# Patient Record
Sex: Female | Born: 1970
Health system: Southern US, Community
[De-identification: ages and names within clinical notes are randomized; demographics above are authoritative.]

## PROBLEM LIST (undated history)

## (undated) DIAGNOSIS — I10 Essential (primary) hypertension: Secondary | ICD-10-CM

## (undated) DIAGNOSIS — E7800 Pure hypercholesterolemia, unspecified: Secondary | ICD-10-CM

## (undated) DIAGNOSIS — E119 Type 2 diabetes mellitus without complications: Secondary | ICD-10-CM

## (undated) DIAGNOSIS — K219 Gastro-esophageal reflux disease without esophagitis: Secondary | ICD-10-CM

## (undated) HISTORY — DX: Type 2 diabetes mellitus without complications: E11.9

## (undated) HISTORY — PX: BREAST REDUCTION SURGERY: SHX8

## (undated) HISTORY — DX: Essential (primary) hypertension: I10

## (undated) HISTORY — PX: ABDOMINAL HYSTERECTOMY: SHX81

## (undated) HISTORY — DX: Pure hypercholesterolemia, unspecified: E78.00

## (undated) HISTORY — PX: REDUCTION MAMMAPLASTY: SUR839

---

## 2005-03-18 ENCOUNTER — Ambulatory Visit: Payer: Self-pay

## 2005-04-25 ENCOUNTER — Ambulatory Visit: Payer: Self-pay

## 2007-08-27 HISTORY — PX: CHOLECYSTECTOMY: SHX55

## 2008-02-04 ENCOUNTER — Ambulatory Visit: Payer: Self-pay | Admitting: Internal Medicine

## 2010-07-12 ENCOUNTER — Ambulatory Visit: Payer: Self-pay | Admitting: Internal Medicine

## 2011-07-25 ENCOUNTER — Ambulatory Visit: Payer: Self-pay | Admitting: Unknown Physician Specialty

## 2011-07-30 ENCOUNTER — Ambulatory Visit: Payer: Self-pay | Admitting: Internal Medicine

## 2013-08-05 ENCOUNTER — Ambulatory Visit: Payer: Self-pay | Admitting: Unknown Physician Specialty

## 2013-09-20 ENCOUNTER — Institutional Professional Consult (permissible substitution): Payer: Self-pay | Admitting: Pulmonary Disease

## 2013-09-24 ENCOUNTER — Encounter: Payer: Self-pay | Admitting: Pulmonary Disease

## 2013-09-24 ENCOUNTER — Ambulatory Visit (INDEPENDENT_AMBULATORY_CARE_PROVIDER_SITE_OTHER): Payer: 59 | Admitting: Pulmonary Disease

## 2013-09-24 VITALS — BP 136/84 | HR 77 | Temp 98.6°F | Ht 67.75 in | Wt 270.0 lb

## 2013-09-24 DIAGNOSIS — R059 Cough, unspecified: Secondary | ICD-10-CM

## 2013-09-24 DIAGNOSIS — R05 Cough: Secondary | ICD-10-CM

## 2013-09-24 MED ORDER — IPRATROPIUM BROMIDE 0.03 % NA SOLN: 2.0000 | Freq: Four times a day (QID) | NASAL | Status: DC

## 2013-09-24 NOTE — Progress Notes (Signed)
Subjective:    Patient ID: Susan Kramer, female    DOB: [birth year], 43 y.o.   MRN: [id number]  HPI  Susan Kramer is referred to [place] for chronic cough.  She has seen Dr. Clovis Pu her PCP and Dr. Tami Ribas with [facility] regarding this.  She has had her BP medicines changed, and had a negative CT sinues and allergy testing.  Apparently the CT was normal and the allergy testing was normal too.  She has had the cough for a year not.  She feels that it has been getting worse. Some days she coughs all day until bedtime  She does not waking up coughing in the mddle of the night.  She coughs a lot in the mornig s and when she is eating.  She will someties having coughing spells and will throw up afterwards.  Sometimes she produces clear to yellow sputum productions.  She is not having vomiting, but she produces sptums.  Th esputum production has worsened in the last year.  Childhood was normal no respiratory illnesses.  She did have a bad spell of bronchitis once two years ago and she was given an inhaler for wheezing.  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    When she gets upset about something her cough gets worse. Perfumes and cough will make the cough worse.  Sometimes it feels like food idsn't digesting and that's why she coughs.  Drinking coffee makes her cough more. She has dyspnea only with bad coughing.    She does have a lot of sinus congestion and feels like she has a cold all the time. She feels a lot of stuffy nose.  She blows her nose a lot.   She has been taking alka-seltzer over the ocunter and other cough remedies.   She has had problems with cough, mucus production, with pollen  Never smoker  She still has indigestion despite pantoprazole twice a day.  She doesn't have heart burn, but she feels food come up after eating.  Food doen's get stuck.   Past Medical History  Diagnosis Date  . High blood pressure   . High cholesterol   . Diabetes      Family  History  Problem Relation Age of Onset  . Diabetes Mother      History   Social History  . Marital Status: Married    Spouse Name: N/A    Number of Children: N/A  . Years of Education: N/A   Occupational History  . Not on file.   Social History Main Topics  . Smoking status: Never Smoker   . Smokeless tobacco: Never Used  . Alcohol Use: Yes     Comment: occasional wine  . Drug Use: No  . Sexual Activity: Not on file   Other Topics Concern  . Not on file   Social History Narrative  . No narrative on file     Allergies no known allergies   No outpatient prescriptions prior to visit.   No facility-administered medications prior to visit.      Review of Systems  Constitutional: Negative for fever and unexpected weight change.  HENT: Positive for congestion. Negative for dental problem, ear pain, nosebleeds, postnasal drip, rhinorrhea, sinus pressure, sneezing, sore throat and trouble swallowing.   Eyes: Negative for redness and itching.  Respiratory: Positive for cough. Negative for chest tightness, shortness of breath and wheezing.   Cardiovascular: Negative for palpitations and leg  swelling.  Gastrointestinal: Negative for nausea and vomiting.  Genitourinary: Negative for dysuria.  Musculoskeletal: Negative for joint swelling.  Skin: Negative for rash.  Neurological: Negative for headaches.  Hematological: Does not bruise/bleed easily.  Psychiatric/Behavioral: Negative for dysphoric mood. The patient is not nervous/anxious.        Objective:   Physical Exam Filed Vitals:   09/24/13 0909  BP: 136/84  Pulse: 77  Temp: 98.6 F (37 C)  TempSrc: Oral  Height: 5' 7.75" (1.721 m)  Weight: 270 lb (122.471 kg)  SpO2: 99%   RA  Gen: well appearing, no acute distress HEENT: NCAT, PERRL, EOMi, OP clear, neck supple without masses PULM: CTA B CV: RRR, no mgr, no JVD AB: BS+, soft, nontender, no hsm Ext: warm, no edema, no clubbing, no cyanosis Derm: no  rash or skin breakdown Neuro: A&Ox4, CN II-XII intact, strength 5/5 in all 4 extremities  08/2013 CXR reviewed> normal     Assessment & Plan:   Cough Based on her normal exam, oximetry, and Chest X-ray I don't think we are dealing with lung pathology here.  However we will get PFTs for completeness sake.  I really think that her cough is due to ongoing acid reflux despite the BID ppi.  She has had a very thorough work up to far and the approach taken by Drs. Caryl Comes and Tami Ribas is exactly what I would have done up until this point. However, she continues to have symptoms of indigestion and cough worse after eating.  Post nasal drip appears to contribute somewhat to her symptoms, probably from some degree of post nasal drip.  There may also be some degree of cyclical cough (cough due to laryngeal irritation caused by cough).    Plan: -voice rest, delsym, throat soothing candies/warm beverages recommended -nasonex bid (instructed on proper use) -chlorpheniramine-phenylephrine tabs as needed  -GERD lifestyle modification reviewed at length and literature provided -if no improvement with these measures, I think she needs a 24 hours esophageal impedence/pH vs manometry to evaluate her esophagus   Updated Medication List Outpatient Encounter Prescriptions as of 09/24/2013  Medication Sig  . atorvastatin (LIPITOR) 80 MG tablet Take 80 mg by mouth at bedtime.  Marland Kitchen glimepiride (AMARYL) 2 MG tablet Take 2 mg by mouth 2 (two) times daily.  . metFORMIN (GLUCOPHAGE) 1000 MG tablet Take 1,000 mg by mouth 2 (two) times daily with a meal.  . pantoprazole (PROTONIX) 40 MG tablet Take 40 mg by mouth 2 (two) times daily.  Marland Kitchen telmisartan (MICARDIS) 40 MG tablet Take 40 mg by mouth daily. 2 po daily  . ipratropium (ATROVENT) 0.03 % nasal spray Place 2 sprays into the nose 4 (four) times daily.

## 2013-09-24 NOTE — Patient Instructions (Addendum)
Use ipratropium nasal spray 2 sprays each nostril three to four times a day for sinus congestion Use chlorpheniramine-phenylephrine combination tablets as needed for sinus congestion   Follow the GERD lifestyle sheet we provided you in the office today. Continue taking the pantoprazole twice a day as prescribed by Dr. Caryl Comes  We will see you back in 3-4 weeks or sooner if needed

## 2013-09-27 DIAGNOSIS — R059 Cough, unspecified: Secondary | ICD-10-CM | POA: Insufficient documentation

## 2013-09-27 DIAGNOSIS — R05 Cough: Secondary | ICD-10-CM | POA: Insufficient documentation

## 2013-09-27 NOTE — Assessment & Plan Note (Addendum)
Based on her normal exam, oximetry, and Chest X-ray I don't think we are dealing with lung pathology here.  However we will get PFTs for completeness sake.  I really think that her cough is due to ongoing acid reflux despite the BID ppi.  She has had a very thorough work up to far and the approach taken by Drs. Caryl Comes and Tami Ribas is exactly what I would have done up until this point. However, she continues to have symptoms of indigestion and cough worse after eating.  Post nasal drip appears to contribute somewhat to her symptoms, probably from some degree of post nasal drip.  There may also be some degree of cyclical cough (cough due to laryngeal irritation caused by cough).    Plan: -voice rest, delsym, throat soothing candies/warm beverages recommended -nasonex bid (instructed on proper use) -chlorpheniramine-phenylephrine tabs as needed  -GERD lifestyle modification reviewed at length and literature provided -if no improvement with these measures, I think she needs a 24 hours esophageal impedence/pH vs manometry to evaluate her esophagus

## 2013-10-05 ENCOUNTER — Telehealth: Payer: Self-pay | Admitting: Pulmonary Disease

## 2013-10-05 ENCOUNTER — Ambulatory Visit: Payer: Self-pay | Admitting: Pulmonary Disease

## 2013-10-05 LAB — PULMONARY FUNCTION TEST

## 2013-10-06 NOTE — Telephone Encounter (Signed)
error 

## 2013-10-13 ENCOUNTER — Encounter: Payer: Self-pay | Admitting: Pulmonary Disease

## 2013-10-14 ENCOUNTER — Telehealth: Payer: Self-pay

## 2013-10-14 NOTE — Telephone Encounter (Signed)
lmtcb X1 

## 2013-10-14 NOTE — Telephone Encounter (Signed)
Message copied by Len Blalock on Thu Oct 14, 2013  4:38 PM ------      Message from: Simonne Maffucci B      Created: Wed Oct 13, 2013 10:59 PM       A,            Her pfts were normal with the exception of a finding that showed her ability to take up oxygen from the air into her blood was a little limited.  This can be finicky test and mean nothing sometimes, but we should follow it up none the less. Please let her know that I want to see the results of her most recent CBC, and if there hasn't been one in a few months then we need to order a new one.            Thanks,      B ------

## 2013-10-15 NOTE — Telephone Encounter (Signed)
Spoke with pt, she is aware of PFT results.  She has not had labs drawn recently.  States that her cough is still persistent, was wanting to come back in and see BQ.  She states that she would prefer to wait and have her labs drawn at her next visit.  I have scheduled her for next Friday, the 27th, at 2:15 in [place].  Nothing further needed at this time.

## 2013-10-22 ENCOUNTER — Ambulatory Visit (INDEPENDENT_AMBULATORY_CARE_PROVIDER_SITE_OTHER): Payer: 59 | Admitting: Pulmonary Disease

## 2013-10-22 ENCOUNTER — Encounter: Payer: Self-pay | Admitting: Internal Medicine

## 2013-10-22 ENCOUNTER — Encounter: Payer: Self-pay | Admitting: Pulmonary Disease

## 2013-10-22 VITALS — BP 136/84 | HR 88 | Ht 67.75 in | Wt 270.0 lb

## 2013-10-22 DIAGNOSIS — R942 Abnormal results of pulmonary function studies: Secondary | ICD-10-CM

## 2013-10-22 DIAGNOSIS — R05 Cough: Secondary | ICD-10-CM

## 2013-10-22 DIAGNOSIS — R059 Cough, unspecified: Secondary | ICD-10-CM

## 2013-10-22 LAB — CBC
HCT: 41 % (ref 36.0–46.0)
Hemoglobin: 13.1 g/dL (ref 12.0–15.0)
MCHC: 31.8 g/dL (ref 30.0–36.0)
MCV: 82.3 fl (ref 78.0–100.0)
Platelets: 362 10*3/uL (ref 150.0–400.0)
RBC: 4.98 Mil/uL (ref 3.87–5.11)
RDW: 13.3 % (ref 11.5–14.6)
WBC: 9.7 10*3/uL (ref 4.5–10.5)

## 2013-10-22 MED ORDER — IPRATROPIUM BROMIDE 0.03 % NA SOLN: 2.0000 | Freq: Four times a day (QID) | NASAL | Status: DC

## 2013-10-22 MED ORDER — BENZONATATE 100 MG PO CAPS: 100.0000 mg | ORAL_CAPSULE | Freq: Three times a day (TID) | ORAL | Status: DC | PRN

## 2013-10-22 NOTE — Progress Notes (Signed)
Subjective:    Patient ID: Susan Kramer, female    DOB: 01-Dec-1970, 43 y.o.   MRN: [id number]  Synopsis: Ms. Howden first saw the [facility] in early 2015 for cough.  She has had an extensive work up by her PCP and ENT physicians prior to seeing [place] which included a negative CT sinuses and allergy testing.  The cough was noted to be increased after eating and she noted having the sensation of a stuffy nose a lot. Despite taking BID PPI she noted the sensation of regurgitation frequently.  She had not seen a gastroenterologist.  HPI  10/22/2013 ROV > [place] thinks that the nasal spray helps her cough some.  Some days are worse than others. When she has a cough it makes the cough worse some. Yesterday for example she coughed up a lot of clear mucus.  No shortness of breath.  She noticed that her breathing was better after taking the albuterol treatment for the PFT test.    Past Medical History  Diagnosis Date  . High blood pressure   . High cholesterol   . Diabetes      Review of Systems  Constitutional: Negative for fever, chills and fatigue.  HENT: Positive for postnasal drip, rhinorrhea and sinus pressure.   Respiratory: Positive for cough and shortness of breath. Negative for wheezing.   Cardiovascular: Negative for chest pain, palpitations and leg swelling.       Objective:   Physical Exam Filed Vitals:   10/22/13 1434  BP: 136/84  Pulse: 88  Height: 5' 7.75" (1.721 m)  Weight: 270 lb (122.471 kg)  SpO2: 97%   RA  Ambulated 500 feet on RA and did not desaturate  Gen: obese, well appearing, no acute distress HEENT: NCAT, EOMi, OP clear, PULM: CTA B CV: RRR, no mgr, no JVD AB: BS+, soft, nontender, no hsm Ext: warm, trace edema, no clubbing, no cyanosis Derm: no rash or skin breakdown  09/2013 PFT> normal ratio, FEV1 2.34 L (80%), no change with BD, TLC 4.56L (81% pred), DLCO 19.6 (66% pred)     Assessment & Plan:   Cough I believe strongly that her  cough is do to acid reflux as her diet affects the cough significantly. She does not have much evidence of ongoing postnasal drip and there is no clear evidence of an underlying lung disease save the minor abnormality seen on the pulmonary function tests, see below.  Plan: -Continue acid reflux lifestyle modification -Continue twice a day PPI -Refer to GI medicine for consideration of 24-hour impedence plus pH probe or further evaluation as directed by Dr. Zenovia Jarred   Abnormal PFT Her recent pulmonary function testing were completely normal with the exception of an isolated low DLCO. This can be due to anemia, disorders of ulnar parenchymal interstitium, or pulmonary vascular disease. We did not have a recent hemoglobin to compare to so we will check that today. If her hemoglobin is negative then she needs to undergo an echocardiogram and a CT scan of her chest to look for further evidence of lung disease. However, I do not expect that there'll be any evidence of lung disease given her normal exam and oximetry. Today in the office we had her ambulate 500 feet on room air and her oxygen level dropped no lower than 96%.  Plan: -Check CBC for hemoglobin level -If hemoglobin normal then order echocardiogram and CT chest    Updated Medication List Outpatient Encounter Prescriptions as of 10/22/2013  Medication Sig  . atorvastatin (LIPITOR) 80 MG tablet Take 80 mg by mouth at bedtime.  Marland Kitchen glimepiride (AMARYL) 2 MG tablet Take 2 mg by mouth 2 (two) times daily.  Marland Kitchen ipratropium (ATROVENT) 0.03 % nasal spray Place 2 sprays into the nose 4 (four) times daily.  . metFORMIN (GLUCOPHAGE) 1000 MG tablet Take 1,000 mg by mouth 2 (two) times daily with a meal.  . pantoprazole (PROTONIX) 40 MG tablet Take 40 mg by mouth 2 (two) times daily.  Marland Kitchen telmisartan (MICARDIS) 40 MG tablet Take 40 mg by mouth daily. 2 po daily

## 2013-10-22 NOTE — Patient Instructions (Signed)
We will refer you to [facility] ([place]) for evaluation of ongoing acid reflux causing your cough Continue taking the ipratropium nasal spray as needed for sinus congestion We will call you with the results of your blood work Use the Tessalon perles as needed for cough We will see you back in 6-8 weeks or sooner if needed

## 2013-10-22 NOTE — Assessment & Plan Note (Signed)
Her recent pulmonary function testing were completely normal with the exception of an isolated low DLCO. This can be due to anemia, disorders of ulnar parenchymal interstitium, or pulmonary vascular disease. We did not have a recent hemoglobin to compare to so we will check that today. If her hemoglobin is negative then she needs to undergo an echocardiogram and a CT scan of her chest to look for further evidence of lung disease. However, I do not expect that there'll be any evidence of lung disease given her normal exam and oximetry. Today in the office we had her ambulate 500 feet on room air and her oxygen level dropped no lower than 96%.  Plan: -Check CBC for hemoglobin level -If hemoglobin normal then order echocardiogram and CT chest

## 2013-10-22 NOTE — Assessment & Plan Note (Signed)
I believe strongly that her cough is do to acid reflux as her diet affects the cough significantly. She does not have much evidence of ongoing postnasal drip and there is no clear evidence of an underlying lung disease save the minor abnormality seen on the pulmonary function tests, see below.  Plan: -Continue acid reflux lifestyle modification -Continue twice a day PPI -Refer to GI medicine for consideration of 24-hour impedence plus pH probe or further evaluation as directed by Dr. Zenovia Jarred

## 2013-10-23 MED ORDER — ALBUTEROL SULFATE HFA 108 (90 BASE) MCG/ACT IN AERS: 2.0000 | INHALATION_SPRAY | Freq: Four times a day (QID) | RESPIRATORY_TRACT | Status: DC | PRN

## 2013-10-25 ENCOUNTER — Telehealth: Payer: Self-pay

## 2013-10-25 NOTE — Telephone Encounter (Signed)
Message copied by Len Blalock on Mon Oct 25, 2013  8:48 AM ------      Message from: Juanito Doom      Created: Sat Oct 23, 2013  8:54 AM       A,            Can you call her and let her know I sent in an Rx for albuterol since she said that she felt better after using it during her PFT.            Thanks      B ------

## 2013-10-25 NOTE — Telephone Encounter (Signed)
Spoke with pt's spouse to have pt cb when she is available.

## 2013-10-26 ENCOUNTER — Telehealth: Payer: Self-pay

## 2013-10-26 NOTE — Telephone Encounter (Signed)
Pt aware of lab results and that the albuterol has been called in.  Nothing further needed.

## 2013-10-26 NOTE — Progress Notes (Signed)
lmtcb X1 

## 2013-10-26 NOTE — Telephone Encounter (Signed)
Message copied by Len Blalock on Tue Oct 26, 2013  9:38 AM ------      Message from: Juanito Doom      Created: Tue Oct 26, 2013  5:52 AM       A,      Please let her know her blood work was good      Thanks      B ------

## 2013-11-10 ENCOUNTER — Encounter: Payer: Self-pay | Admitting: Pulmonary Disease

## 2013-11-15 ENCOUNTER — Telehealth: Payer: Self-pay | Admitting: Pulmonary Disease

## 2013-11-15 NOTE — Telephone Encounter (Signed)
OK, but just let her know that those tests were ordered because the PFT suggested there was a problem with oxygen getting into her blood stream.  I think that it is unlikely she has a lung problem, but just make sure she understands that they were ordered for this reason and not the cough

## 2013-11-15 NOTE — Telephone Encounter (Signed)
Called and spoke with pt. She is scheduled to see Dr. Hilarie Fredrickson 11/27/13 for eval. She wants to wait and have the CT chest and u/s done until after she see's Dr Hilarie Fredrickson to make sure it is not GI related. She reports her insurance does not pay for any of these tests and they are expensive. If she finds it is not GI related then she will call us back to have these tests scheduled. FYI for Dr. Lake Bells

## 2013-11-16 NOTE — Telephone Encounter (Signed)
Called made pt aware. Nothing further needed 

## 2013-11-29 ENCOUNTER — Encounter: Payer: Self-pay | Admitting: Internal Medicine

## 2013-12-03 ENCOUNTER — Encounter: Payer: Self-pay | Admitting: Internal Medicine

## 2013-12-03 ENCOUNTER — Ambulatory Visit: Payer: 59 | Admitting: Internal Medicine

## 2013-12-03 ENCOUNTER — Ambulatory Visit (INDEPENDENT_AMBULATORY_CARE_PROVIDER_SITE_OTHER): Payer: 59 | Admitting: Internal Medicine

## 2013-12-03 VITALS — BP 140/88 | HR 88 | Ht 67.32 in | Wt 267.2 lb

## 2013-12-03 DIAGNOSIS — K219 Gastro-esophageal reflux disease without esophagitis: Secondary | ICD-10-CM

## 2013-12-03 DIAGNOSIS — R059 Cough, unspecified: Secondary | ICD-10-CM

## 2013-12-03 DIAGNOSIS — R053 Chronic cough: Secondary | ICD-10-CM

## 2013-12-03 DIAGNOSIS — R05 Cough: Secondary | ICD-10-CM

## 2013-12-03 NOTE — Patient Instructions (Signed)
Stay on pantoprazole 40 mg twice a day.  Call us back to schedule an endoscopy which is recommended by Dr. Hilarie Fredrickson   [phone]                                               We are excited to introduce MyChart, a new best-in-class service that provides you online access to important information in your electronic medical record. We want to make it easier for you to view your health information - all in one secure location - when and where you need it. We expect MyChart will enhance the quality of care and service we provide.  When you register for MyChart, you can:    View your test results.    Request appointments and receive appointment reminders via email.    Request medication renewals.    View your medical history, allergies, medications and immunizations.    Communicate with your physician's office through a password-protected site.    Conveniently print information such as your medication lists.  To find out if MyChart is right for you, please talk to a member of our clinical staff today. We will gladly answer your questions about this free health and wellness tool.  If you are age 45 or older and want a member of your family to have access to your record, you must provide written consent by completing a proxy form available at our office. Please speak to our clinical staff about guidelines regarding accounts for patients younger than age 1.  As you activate your MyChart account and need any technical assistance, please call the MyChart technical support line at [phone]) or email your question to [email or web address]. If you email your question(s), please include your name, a return phone number and the best time to reach you.  If you have non-urgent health-related questions, you can send a message to our office through [place] at [email or web address]. If you have a medical emergency, call 911.  Thank you for using MyChart as your new health and  wellness resource!   MyChart licensed from [employer],  1999-2010. Patents Pending.

## 2013-12-03 NOTE — Progress Notes (Signed)
Patient ID: Susan Kramer, female   DOB: 1971/04/01, 43 y.o.   MRN: [id number] HPI: Susan Kramer is a 43 year old female with a past medical history of hypertension, hypercholesterolemia and diabetes who seen in consultation at the request of Dr. Lake Bells to evaluate chronic cough. She is here alone today. He reports she has had issues with chronic cough over the last year. This is in general a dry cough and can occur both during the day and at nighttime. She reports she has good days and bad days and on bad days she can cough so much she feels abdominal soreness and at times shortness of breath. She rarely uses an albuterol inhaler which doesn't seem to help her cough very much. The cough was felt possibly reflux related and she has been taking pantoprazole 40 mg twice daily. She certainly feels like this "calms down" the cough but it has not resolved entirely. She tried Dexilant before pantoprazole without much benefit. She has seen ENT and had allergy testing which was reportedly negative. She denies heartburn at present, she does occasionally have water brash. No dysphagia or odynophagia. Good appetite. No abd pain. No nausea or vomiting. Normal bowel movements without blood in her stool or melena. No hepatobiliary complaints. [place] did not feel that she had asthma and felt her cough is likely not pulmonary related. Her PFTs were unremarkable. She reports she's been on vacation for the past week and her cough has been almost nonexistent. She wonders if stress can contribute to her coughing.  She denies a family history of colon cancer or IBD  Past Medical History  Diagnosis Date  . High blood pressure   . High cholesterol   . Diabetes     Past Surgical History  Procedure Laterality Date  . Cholecystectomy  2009    Current Outpatient Prescriptions  Medication Sig Dispense Refill  . albuterol (PROVENTIL HFA;VENTOLIN HFA) 108 (90 BASE) MCG/ACT inhaler Inhale 2 puffs into the lungs every 6 (six)  hours as needed for wheezing or shortness of breath.  1 Inhaler  2  . atorvastatin (LIPITOR) 80 MG tablet Take 80 mg by mouth at bedtime.      . benzonatate (TESSALON PERLES) 100 MG capsule Take 1 capsule (100 mg total) by mouth 3 (three) times daily as needed for cough.  30 capsule  1  . glimepiride (AMARYL) 2 MG tablet Take 2 mg by mouth 2 (two) times daily.      Marland Kitchen ipratropium (ATROVENT) 0.03 % nasal spray Place 2 sprays into the nose 4 (four) times daily.  30 mL  5  . metFORMIN (GLUCOPHAGE) 1000 MG tablet Take 1,000 mg by mouth 2 (two) times daily with a meal.      . pantoprazole (PROTONIX) 40 MG tablet Take 40 mg by mouth 2 (two) times daily.      Marland Kitchen telmisartan (MICARDIS) 40 MG tablet Take 40 mg by mouth daily. 2 po daily       No current facility-administered medications for this visit.    No Known Allergies  Family History  Problem Relation Age of Onset  . Diabetes Mother     History  Substance Use Topics  . Smoking status: Never Smoker   . Smokeless tobacco: Never Used  . Alcohol Use: Yes     Comment: occasional wine    ROS: As per history of present illness, otherwise negative  BP 140/88  Pulse 88  Ht 5' 7.32" (1.71 m)  Wt 267 lb 4 oz (121.224  kg)  BMI 41.46 kg/m2  LMP 12/02/2013 Constitutional: Well-developed and well-nourished. No distress. HEENT: Normocephalic and atraumatic. Oropharynx is clear and moist. No oropharyngeal exudate. Conjunctivae are normal.  No scleral icterus. Neck: Neck supple. Trachea midline. Cardiovascular: Normal rate, regular rhythm and intact distal pulses. No M/R/G Pulmonary/chest: Effort normal and breath sounds normal. No wheezing, rales or rhonchi. Abdominal: Soft, nontender, nondistended. Bowel sounds active throughout.  Extremities: no clubbing, cyanosis, or edema Lymphadenopathy: No cervical adenopathy noted. Neurological: Alert and oriented to person place and time. Skin: Skin is warm and dry. No rashes noted. Psychiatric:  Normal mood and affect. Behavior is normal.  RELEVANT LABS AND IMAGING: CBC    Component Value Date/Time   WBC 9.7 10/22/2013 1506   RBC 4.98 10/22/2013 1506   HGB 13.1 10/22/2013 1506   HCT 41.0 10/22/2013 1506   PLT 362.0 10/22/2013 1506   MCV 82.3 10/22/2013 1506   MCHC 31.8 10/22/2013 1506   RDW 13.3 10/22/2013 1506    ASSESSMENT/PLAN: 43 year old female with a past medical history of hypertension, hypercholesterolemia and diabetes who seen in consultation at the request of Dr. Lake Bells to evaluate chronic cough.  1.  Chronic cough/possible GERD -- Will discuss chronic cough however it can exacerbate this issue. She is on maximum acid suppression with pantoprazole 40 mg twice daily and she is not having symptomatic heartburn. We discussed the importance of a GERD diet as well as avoiding eating within 90 minutes of lying down. We discussed trigger foods such as alcohol, caffeine, chocolate. We discussed upper endoscopy, which I have recommended that she would like to think about. She reports she will call me back to schedule this test. We also discussed 24-hour pH study with impedance, but first we will perform upper endoscopy to rule out structural abnormality. I considered switching to another PPI, but given that she has tried Dexilant in the past, I have the sense that her acid is completely controlled with twice daily PPI at present. We attempted to schedule EGD today, however she prefers to call me back to schedule endoscopy.

## 2014-01-28 DIAGNOSIS — R809 Proteinuria, unspecified: Secondary | ICD-10-CM | POA: Insufficient documentation

## 2014-03-28 DIAGNOSIS — R609 Edema, unspecified: Secondary | ICD-10-CM | POA: Insufficient documentation

## 2014-10-12 ENCOUNTER — Emergency Department: Payer: Self-pay | Admitting: Emergency Medicine

## 2014-10-18 ENCOUNTER — Ambulatory Visit (INDEPENDENT_AMBULATORY_CARE_PROVIDER_SITE_OTHER): Payer: 59 | Admitting: General Surgery

## 2014-10-18 ENCOUNTER — Encounter: Payer: Self-pay | Admitting: General Surgery

## 2014-10-18 ENCOUNTER — Ambulatory Visit: Payer: Self-pay | Admitting: General Surgery

## 2014-10-18 VITALS — BP 138/80 | HR 76 | Resp 12 | Ht 67.0 in | Wt 260.0 lb

## 2014-10-18 DIAGNOSIS — R109 Unspecified abdominal pain: Secondary | ICD-10-CM

## 2014-10-18 MED ORDER — DICYCLOMINE HCL 10 MG PO CAPS: 10.0000 mg | ORAL_CAPSULE | Freq: Three times a day (TID) | ORAL | Status: AC

## 2014-10-18 NOTE — Patient Instructions (Addendum)
Patient advised to take Bentyl 3 times a day for 2 weeks. Patient to return in 2-3 weeks for follow up. The patient is aware to call back for any questions or concerns. Patient also advised to keep a log of what she is eating, episodes of pain, what time etc.

## 2014-10-18 NOTE — Progress Notes (Signed)
Patient ID: Susan Kramer, female   DOB: Feb 22, 1971, 44 y.o.   MRN: [id number]  Chief Complaint  Patient presents with  . Abdominal Pain    evaluation of abdominal pain    HPI Susan Kramer is a 44 y.o. female who presents for an evaluation of abdominal pain. The patient states the pain has been present for approximately 2 years but has gotten worse in the last 2 weeks. She was seen in the ER on 10/12/14 for this and an abdominal CT scan was performed as well as an abdominal x-ray. The pain is located is located bilaterally in the upper quadrant of the stomach. The pain is described as severe pain. She has had 2 episodes in the last couple of weeks. She states when theses episodes occur she gets sweaty, has had diarrhea, and vomiting has occurred on one occasion. She also states if she applies pressure to the area it will subsides. No injuries to the stomach. She is unable to associate the pain with anything she has done.  The patient reports that she will feel a mass protruding from the upper portion of the abdomen. This may be either on the right or left side, and she points to an area near the lateral border of the rectus muscle.  She was prescribed Bentyl through the emergency department and reports she has had less abdominal discomfort since this was initiated.  She has been evaluated for her sinuses by Dr. Tami Ribas. She had a CT of the sinuses and it was negative.   HPI  Past Medical History  Diagnosis Date  . High blood pressure   . High cholesterol   . Diabetes     Past Surgical History  Procedure Laterality Date  . Cholecystectomy  2009    Family History  Problem Relation Age of Onset  . Diabetes Mother   . Colon polyps Father     Social History History  Substance Use Topics  . Smoking status: Never Smoker   . Smokeless tobacco: Never Used  . Alcohol Use: 0.0 oz/week    0 Standard drinks or equivalent per week     Comment: occasional wine    No Known  Allergies  Current Outpatient Prescriptions  Medication Sig Dispense Refill  . amLODipine (NORVASC) 10 MG tablet Take 1 tablet by mouth daily.    Marland Kitchen dicyclomine (BENTYL) 10 MG capsule Take 10 mg by mouth every 6 (six) hours as needed for spasms.    Marland Kitchen glimepiride (AMARYL) 2 MG tablet Take 2 mg by mouth 2 (two) times daily.    . hydrochlorothiazide (HYDRODIURIL) 25 MG tablet Take 1 tablet by mouth daily.    . metFORMIN (GLUCOPHAGE) 1000 MG tablet Take 1,000 mg by mouth 2 (two) times daily with a meal.    . pantoprazole (PROTONIX) 40 MG tablet Take 40 mg by mouth 2 (two) times daily.    Marland Kitchen dicyclomine (BENTYL) 10 MG capsule Take 1 capsule (10 mg total) by mouth 4 (four) times daily -  before meals and at bedtime. 90 capsule 2   No current facility-administered medications for this visit.    Review of Systems Review of Systems  Constitutional: Negative.   Respiratory: Negative.   Cardiovascular: Negative.   Gastrointestinal: Positive for vomiting, abdominal pain and diarrhea.    Blood pressure 138/80, pulse 76, resp. rate 12, height 5\' 7"  (1.702 m), weight 260 lb (117.935 kg), last menstrual period 09/27/2014.  Physical Exam Physical Exam  Constitutional: She is oriented  to person, place, and time. She appears well-developed and well-nourished.  Cardiovascular: Normal rate, regular rhythm and normal heart sounds.   No murmur heard. Pulmonary/Chest: Effort normal and breath sounds normal.  Abdominal: Soft. Normal appearance and bowel sounds are normal. There is no hepatosplenomegaly. There is no tenderness. No hernia.    Neurological: She is alert and oriented to person, place, and time.  Skin: Skin is warm and dry.    Data Reviewed ED records of 10/12/2014 were reviewed.  ECG suggested anterior septal infarct, age undetermined. No history of cardiac dysfunction.  Laboratory studies showed a white blood cell count of 11,500. No differential. Hemoglobin 13.7 with an MCV of 79.  Elevated blood sugar of 186, normal electrolytes, creatinine of 0.8. Estimated GFR greater than 60.  CT of the abdomen and pelvis was independently reviewed. Multiple small bilateral pulmonary nodules reported, largest 1 cm. It had extended ptosis. 2.7 cm right adrenal mass for which additional imaging studies were suggested.  Three-way of the abdomen was unremarkable.    Assessment    Unexplained abdominal pain.  Abnormal CT findings of the chest and right adrenal.  ECG suggesting old infarct.    Plan    The patient will make use of Bentyl 10 mg 3 times a day for the next 2 weeks.  We'll arrange for a follow-up chest CT without contrast to better assess the reported pulmonary nodules.  PCP evaluation of any past cardiac events.        PCP:  Earvin Hansen 10/19/2014, 8:16 PM

## 2014-10-19 DIAGNOSIS — R109 Unspecified abdominal pain: Secondary | ICD-10-CM | POA: Insufficient documentation

## 2014-10-20 ENCOUNTER — Telehealth: Payer: Self-pay | Admitting: *Deleted

## 2014-10-20 ENCOUNTER — Telehealth: Payer: Self-pay | Admitting: General Surgery

## 2014-10-20 NOTE — Telephone Encounter (Signed)
Patient notified as instructed. She will check her work schedule and call back with a time that will be convenient for her to have follow up CT scan.  Dr. Olin Pia office was also contacted and they will be faxing old ECG's.

## 2014-10-20 NOTE — Telephone Encounter (Signed)
-----   Message from Robert Bellow, MD sent at 10/19/2014  8:22 PM EST ----- Please arrange for a noncontrast CT of the chest follow-up previously identified pulmonary and right adrenal gland nodules.  This can be obtained anytime between now and her follow-up appointment.  Contact PCP office for any old ECGs. Thank you

## 2014-10-20 NOTE — Telephone Encounter (Signed)
I spoke with the patient regarding the abnormal ECG completed in the emergency department. I spoke with her primary care physician, Ramonita Lab, M.D. who make arrangements to over read the ECG and determine if additional testing is necessary.  The patient reports that several years ago she did have a stress test for some chest pain which by her report was negative.  We discussed plans for the noncontrast chest CT to evaluate the nodules identified in the lower lobes on her abdominal pelvic CT.

## 2014-10-20 NOTE — Telephone Encounter (Signed)
Per Dr. Olin Pia nurse, Judson Roch, they have no record of any previous ECG reports on this patient.

## 2014-10-23 DIAGNOSIS — R918 Other nonspecific abnormal finding of lung field: Secondary | ICD-10-CM | POA: Insufficient documentation

## 2014-10-25 ENCOUNTER — Telehealth: Payer: Self-pay | Admitting: *Deleted

## 2014-10-25 NOTE — Telephone Encounter (Signed)
fmla done and faxed

## 2014-10-27 ENCOUNTER — Telehealth: Payer: Self-pay | Admitting: *Deleted

## 2014-10-27 NOTE — Telephone Encounter (Signed)
Patient called back to arrange CT scan. She states she needs to do this around [facility] training.   This patient has been scheduled for a CT chest without contrast at [facility] for 11-02-14 at 8:30 am (arrive 8:15 am). Prep: none but take medication list.  Message was left on patient's home answering machine with information per patient's verbal request.

## 2014-11-02 ENCOUNTER — Ambulatory Visit: Payer: Self-pay | Admitting: General Surgery

## 2014-11-02 ENCOUNTER — Telehealth: Payer: Self-pay | Admitting: General Surgery

## 2014-11-02 NOTE — Telephone Encounter (Signed)
CT results reviewed. Will have films reviewed by [facility].

## 2014-11-03 ENCOUNTER — Encounter: Payer: Self-pay | Admitting: General Surgery

## 2014-11-07 ENCOUNTER — Encounter: Payer: Self-pay | Admitting: *Deleted

## 2014-11-07 ENCOUNTER — Telehealth: Payer: Self-pay | Admitting: *Deleted

## 2014-11-07 NOTE — Telephone Encounter (Signed)
-----   Message from Robert Bellow, MD sent at 11/05/2014  8:19 AM EST ----- Please schedule the patient for a pet CT of the chest for multiple pulmonary nodules. ----- Message -----    From: Augustin Schooling, CMA    Sent: 11/03/2014  11:08 AM      To: Robert Bellow, MD

## 2014-11-07 NOTE — Telephone Encounter (Signed)
Message for patient or spouse to call the office. We have patient scheduled for a PET scan at the date and time below and need to inform them.  Patient has been scheduled for a PET/CT lung at [facility] for Thursday, 11-10-14 at 1 pm (arrive 12:45 pm). She will take all medications as prescribed including Metformin (patient was under the impression she would have to hold for PET scan).   This patient has been notified of diabetic prep instructions in previous phone conversation. She reports last menstrual period was 10-15-14. No pregnancy test will be required per the Scheduling Department.   Patient states she forgot to mention that she did have a breast reduction around 12 years ago.

## 2014-11-07 NOTE — Telephone Encounter (Signed)
Spoke with husband this morning and he states patient is at work.  He will contact her and have patient give the office a call back.

## 2014-11-07 NOTE — Telephone Encounter (Signed)
Patient notified as instructed.   This patient requested a prescription for medication to help her relax during scan. She will be provided with a prescription for Valium. Patient will come by the office tomorrow and pick up (up front).   Patient aware of the need for a driver day of scan. Also, spoke with the [facility] Medicine Department and they have asked that patient not take Valium until she gets to [facility] and they tell her it is okay to take since they have to ask her questions upon arrival. Patient will not have to sign a consent ahead of time per department.

## 2014-11-07 NOTE — Telephone Encounter (Signed)
Valium 10 mg for use one hour prior to the PET scan has been provided to the patient. She is aware that she needs to have a driver after using this medication. (Reports troublesome claustrophobia w/ MRI in the past).

## 2014-11-11 ENCOUNTER — Encounter: Payer: Self-pay | Admitting: General Surgery

## 2014-11-14 ENCOUNTER — Telehealth: Payer: Self-pay | Admitting: General Surgery

## 2014-11-14 NOTE — Telephone Encounter (Signed)
The patient was notified that the PET/CT did not show any areas of increased activity in the chest. Only the largest nodule in the left lower lobe would likely have let up, and while this is seen on the accompanying CT there is no increased activity. There was a suggestion of areas of increased activity in the left parotid gland. She'd like to see Anda Latina, M.D. from ENT and will make arrangements for this.  She hasn't appointment on April 28 and we'll plan to keep that to review the PET scan results.

## 2014-11-15 ENCOUNTER — Telehealth: Payer: Self-pay

## 2014-11-15 NOTE — Telephone Encounter (Signed)
Spoke with patient and she is aware of he appointment on 11/29/14 with Dr Tami Ribas.

## 2014-11-15 NOTE — Telephone Encounter (Signed)
Patient is scheduled to see Dr Beverly Gust with [facility] on 11/29/14 at 1:35 pm. Message left for the patient to call back to review this information.

## 2014-11-21 ENCOUNTER — Ambulatory Visit (INDEPENDENT_AMBULATORY_CARE_PROVIDER_SITE_OTHER): Payer: 59 | Admitting: General Surgery

## 2014-11-21 VITALS — BP 136/76 | HR 74 | Resp 14 | Ht 67.0 in | Wt 261.0 lb

## 2014-11-21 DIAGNOSIS — R109 Unspecified abdominal pain: Secondary | ICD-10-CM | POA: Diagnosis not present

## 2014-11-21 DIAGNOSIS — R918 Other nonspecific abnormal finding of lung field: Secondary | ICD-10-CM | POA: Diagnosis not present

## 2014-11-21 DIAGNOSIS — R220 Localized swelling, mass and lump, head: Secondary | ICD-10-CM | POA: Diagnosis not present

## 2014-11-21 DIAGNOSIS — K118 Other diseases of salivary glands: Secondary | ICD-10-CM

## 2014-11-21 NOTE — Progress Notes (Signed)
Patient ID: Susan Kramer, female   DOB: 07-Aug-1971, 44 y.o.   MRN: [id number]  Chief Complaint  Patient presents with  . Abdominal Pain    HPI Susan Kramer is a 44 y.o. female. here today for her follow up abdominal pain. Patient states she is not having any more pain. The use of Bentyl has resolved her abdominal distress. She is doing well at this time.  CT of the abdomen had been negative 4 upper abdominal pathology, but suggested pulmonary nodules especially in the left lower lobe. A mass off the posterior superior aspect of the uterus was thought to represent a leiomyoma. She is undergone a dedicated chest CT showing innumerable nodules the largest being just 10 mm in the left lower lobe. The images had been reviewed thoracic surgery, and an formal recommendation was for open lung biopsy for the possibility of metastatic low-grade leiomyosarcoma. Prior to committing the patient to a thoracotomy, a PET scan was obtained. None of the pulmonary nodules were hypermetabolic, although increased uptake was noted in the left parotid gland. The patient was seen last week by Dr. Tami Ribas from ENT and arrangements are in place for an ultrasound-guided biopsy of the parotid tomorrow.   HPI  Past Medical History  Diagnosis Date  . High blood pressure   . High cholesterol   . Diabetes     Past Surgical History  Procedure Laterality Date  . Cholecystectomy  2009  . Breast reduction surgery      12 years ago    Family History  Problem Relation Age of Onset  . Diabetes Mother   . Colon polyps Father     Social History History  Substance Use Topics  . Smoking status: Never Smoker   . Smokeless tobacco: Never Used  . Alcohol Use: 0.0 oz/week    0 Standard drinks or equivalent per week     Comment: occasional wine    No Known Allergies  Current Outpatient Prescriptions  Medication Sig Dispense Refill  . amLODipine (NORVASC) 10 MG tablet Take 1 tablet by mouth daily.    Marland Kitchen dicyclomine  (BENTYL) 10 MG capsule Take 1 capsule (10 mg total) by mouth 4 (four) times daily -  before meals and at bedtime. 90 capsule 2  . glimepiride (AMARYL) 2 MG tablet Take 2 mg by mouth 2 (two) times daily.    . hydrochlorothiazide (HYDRODIURIL) 25 MG tablet Take 1 tablet by mouth daily.    . metFORMIN (GLUCOPHAGE) 1000 MG tablet Take 1,000 mg by mouth 2 (two) times daily with a meal.    . pantoprazole (PROTONIX) 40 MG tablet Take 40 mg by mouth 2 (two) times daily.     No current facility-administered medications for this visit.    Review of Systems Review of Systems  Constitutional: Negative.   Respiratory: Negative.   Cardiovascular: Negative.     Blood pressure 136/76, pulse 74, resp. rate 14, height 5\' 7"  (1.702 m), weight 261 lb (118.389 kg).  Physical Exam Physical Exam  Constitutional: She is oriented to person, place, and time. She appears well-developed and well-nourished.  Eyes: Conjunctivae are normal. No scleral icterus.  Neck: Neck supple.    Cardiovascular: Normal rate, regular rhythm and normal heart sounds.   Pulmonary/Chest: Effort normal and breath sounds normal.  Abdominal: Soft. Normal appearance and bowel sounds are normal. There is no hepatomegaly. There is no tenderness. No hernia.  Neurological: She is alert and oriented to person, place, and time.  Skin: Skin  is warm.    Data Reviewed Chest CT and PET/CT reviewed. See discussion above.  Assessment    Multiple pulmonary nodules.  Left parotid mass.  Suspected leiomyoma of the uterus.     Plan    Patient to return for a repeat chest CT in 3 months. If progression of the pulmonary nodules is identified she will likely require a biopsy of the dominant lesion. This may not be amenable to CT approach.      PCP:  Arther Dames, Trecia Rogers 11/21/2014, 10:22 PM

## 2014-11-21 NOTE — Patient Instructions (Signed)
Patient to return in three month.

## 2014-11-22 ENCOUNTER — Ambulatory Visit: Payer: Self-pay | Admitting: Unknown Physician Specialty

## 2014-12-19 LAB — SURGICAL PATHOLOGY

## 2014-12-29 ENCOUNTER — Encounter: Payer: Self-pay | Admitting: General Surgery

## 2015-09-22 ENCOUNTER — Ambulatory Visit: Payer: Self-pay | Admitting: Physician Assistant

## 2015-09-22 ENCOUNTER — Encounter: Payer: Self-pay | Admitting: Physician Assistant

## 2015-09-22 VITALS — BP 160/100 | HR 60 | Temp 98.8°F

## 2015-09-22 DIAGNOSIS — J069 Acute upper respiratory infection, unspecified: Secondary | ICD-10-CM

## 2015-09-22 MED ORDER — CEFDINIR 300 MG PO CAPS: 300.0000 mg | ORAL_CAPSULE | Freq: Two times a day (BID) | ORAL | Status: DC

## 2015-09-22 MED ORDER — FLUCONAZOLE 150 MG PO TABS: 150.0000 mg | ORAL_TABLET | Freq: Once | ORAL | Status: DC

## 2015-09-22 MED ORDER — BENZONATATE 200 MG PO CAPS: 200.0000 mg | ORAL_CAPSULE | Freq: Three times a day (TID) | ORAL | Status: DC | PRN

## 2015-09-22 MED ORDER — PREDNISONE 10 MG PO TABS: 30.0000 mg | ORAL_TABLET | Freq: Every day | ORAL | Status: DC

## 2015-09-22 NOTE — Progress Notes (Signed)
S: C/o runny nose and congestion for 13 days, no fever, chills, cp/sob, v/d; mucus was green this am, cough is sporadic, can't sleep without sitting up because of drainage, ears are plugged up, head is congested  Using otc meds: alka seltzer  O: PE: vitals wnl, nad, perrl eomi, normocephalic, tms dull, nasal mucosa red and swollen, throat injected, neck supple no lymph, lungs c t a, cv rrr, neuro intact  A:  Acute uri   P: omnicef, pred 30mg  qd x 3d, diflucan, tessalon perls ; drink fluids, continue regular meds , use otc meds of choice, return if not improving in 5 days, return earlier if worsening

## 2016-02-08 DIAGNOSIS — E118 Type 2 diabetes mellitus with unspecified complications: Secondary | ICD-10-CM | POA: Insufficient documentation

## 2016-03-01 DIAGNOSIS — R601 Generalized edema: Secondary | ICD-10-CM | POA: Diagnosis not present

## 2016-03-01 DIAGNOSIS — E1165 Type 2 diabetes mellitus with hyperglycemia: Secondary | ICD-10-CM | POA: Diagnosis not present

## 2016-03-01 DIAGNOSIS — E118 Type 2 diabetes mellitus with unspecified complications: Secondary | ICD-10-CM | POA: Diagnosis not present

## 2016-03-07 DIAGNOSIS — E118 Type 2 diabetes mellitus with unspecified complications: Secondary | ICD-10-CM | POA: Diagnosis not present

## 2016-03-07 DIAGNOSIS — I10 Essential (primary) hypertension: Secondary | ICD-10-CM | POA: Diagnosis not present

## 2016-03-07 DIAGNOSIS — R801 Persistent proteinuria, unspecified: Secondary | ICD-10-CM | POA: Diagnosis not present

## 2016-03-07 DIAGNOSIS — E784 Other hyperlipidemia: Secondary | ICD-10-CM | POA: Diagnosis not present

## 2016-04-24 DIAGNOSIS — E784 Other hyperlipidemia: Secondary | ICD-10-CM | POA: Diagnosis not present

## 2016-04-24 DIAGNOSIS — Z6841 Body Mass Index (BMI) 40.0 and over, adult: Secondary | ICD-10-CM | POA: Diagnosis not present

## 2016-04-24 DIAGNOSIS — R601 Generalized edema: Secondary | ICD-10-CM | POA: Diagnosis not present

## 2016-04-24 DIAGNOSIS — I10 Essential (primary) hypertension: Secondary | ICD-10-CM | POA: Diagnosis not present

## 2017-04-24 DIAGNOSIS — R801 Persistent proteinuria, unspecified: Secondary | ICD-10-CM | POA: Diagnosis not present

## 2017-04-24 DIAGNOSIS — E118 Type 2 diabetes mellitus with unspecified complications: Secondary | ICD-10-CM | POA: Diagnosis not present

## 2017-04-24 DIAGNOSIS — I10 Essential (primary) hypertension: Secondary | ICD-10-CM | POA: Diagnosis not present

## 2017-04-24 DIAGNOSIS — E1165 Type 2 diabetes mellitus with hyperglycemia: Secondary | ICD-10-CM | POA: Diagnosis not present

## 2017-04-24 DIAGNOSIS — R601 Generalized edema: Secondary | ICD-10-CM | POA: Diagnosis not present

## 2017-04-30 DIAGNOSIS — E785 Hyperlipidemia, unspecified: Secondary | ICD-10-CM | POA: Diagnosis not present

## 2017-04-30 DIAGNOSIS — R801 Persistent proteinuria, unspecified: Secondary | ICD-10-CM | POA: Diagnosis not present

## 2017-04-30 DIAGNOSIS — E118 Type 2 diabetes mellitus with unspecified complications: Secondary | ICD-10-CM | POA: Diagnosis not present

## 2017-04-30 DIAGNOSIS — I10 Essential (primary) hypertension: Secondary | ICD-10-CM | POA: Diagnosis not present

## 2017-10-10 DIAGNOSIS — J0100 Acute maxillary sinusitis, unspecified: Secondary | ICD-10-CM | POA: Diagnosis not present

## 2018-11-18 DIAGNOSIS — H5213 Myopia, bilateral: Secondary | ICD-10-CM | POA: Diagnosis not present

## 2018-12-11 DIAGNOSIS — E118 Type 2 diabetes mellitus with unspecified complications: Secondary | ICD-10-CM | POA: Diagnosis not present

## 2018-12-11 DIAGNOSIS — R801 Persistent proteinuria, unspecified: Secondary | ICD-10-CM | POA: Diagnosis not present

## 2018-12-11 DIAGNOSIS — I10 Essential (primary) hypertension: Secondary | ICD-10-CM | POA: Diagnosis not present

## 2018-12-11 DIAGNOSIS — E1165 Type 2 diabetes mellitus with hyperglycemia: Secondary | ICD-10-CM | POA: Diagnosis not present

## 2018-12-11 DIAGNOSIS — E785 Hyperlipidemia, unspecified: Secondary | ICD-10-CM | POA: Diagnosis not present

## 2018-12-17 DIAGNOSIS — E118 Type 2 diabetes mellitus with unspecified complications: Secondary | ICD-10-CM | POA: Diagnosis not present

## 2018-12-17 DIAGNOSIS — E785 Hyperlipidemia, unspecified: Secondary | ICD-10-CM | POA: Diagnosis not present

## 2018-12-17 DIAGNOSIS — I10 Essential (primary) hypertension: Secondary | ICD-10-CM | POA: Diagnosis not present

## 2018-12-17 DIAGNOSIS — Z6841 Body Mass Index (BMI) 40.0 and over, adult: Secondary | ICD-10-CM | POA: Diagnosis not present

## 2019-01-07 ENCOUNTER — Emergency Department
Admission: EM | Admit: 2019-01-07 | Discharge: 2019-01-07 | Disposition: A | Discharge status: Home / Self Care | Payer: 59 | Attending: Emergency Medicine | Admitting: Emergency Medicine

## 2019-01-07 ENCOUNTER — Other Ambulatory Visit: Payer: Self-pay

## 2019-01-07 ENCOUNTER — Emergency Department: Payer: 59

## 2019-01-07 ENCOUNTER — Encounter: Payer: Self-pay | Admitting: Emergency Medicine

## 2019-01-07 DIAGNOSIS — Z7984 Long term (current) use of oral hypoglycemic drugs: Secondary | ICD-10-CM | POA: Insufficient documentation

## 2019-01-07 DIAGNOSIS — R911 Solitary pulmonary nodule: Secondary | ICD-10-CM | POA: Diagnosis not present

## 2019-01-07 DIAGNOSIS — Z79899 Other long term (current) drug therapy: Secondary | ICD-10-CM | POA: Insufficient documentation

## 2019-01-07 DIAGNOSIS — R002 Palpitations: Secondary | ICD-10-CM | POA: Insufficient documentation

## 2019-01-07 DIAGNOSIS — E119 Type 2 diabetes mellitus without complications: Secondary | ICD-10-CM | POA: Diagnosis not present

## 2019-01-07 LAB — BASIC METABOLIC PANEL
Anion gap: 14 (ref 5–15)
BUN: 12 mg/dL (ref 6–20)
CO2: 24 mmol/L (ref 22–32)
Calcium: 9.6 mg/dL (ref 8.9–10.3)
Chloride: 100 mmol/L (ref 98–111)
Creatinine, Ser: 0.79 mg/dL (ref 0.44–1.00)
GFR calc Af Amer: >60 mL/min (ref >60)
GFR calc non Af Amer: >60 mL/min (ref >60)
Glucose, Bld: 177 mg/dL — ABNORMAL HIGH (ref 70–99)
Potassium: 3.7 mmol/L (ref 3.5–5.1)
Sodium: 138 mmol/L (ref 135–145)

## 2019-01-07 LAB — CBC
HCT: 45.4 % (ref 36.0–46.0)
Hemoglobin: 14.5 g/dL (ref 12.0–15.0)
MCH: 25.9 pg — ABNORMAL LOW (ref 26.0–34.0)
MCHC: 31.9 g/dL (ref 30.0–36.0)
MCV: 81.2 fL (ref 80.0–100.0)
Platelets: 379 10*3/uL (ref 150–400)
RBC: 5.59 MIL/uL — ABNORMAL HIGH (ref 3.87–5.11)
RDW: 13.7 % (ref 11.5–15.5)
WBC: 7.8 10*3/uL (ref 4.0–10.5)
nRBC: 0 % (ref 0.0–0.2)

## 2019-01-07 LAB — TROPONIN I: Troponin I: <0.03 ng/mL (ref <0.03)

## 2019-01-07 LAB — TSH: TSH: 0.751 u[IU]/mL (ref 0.350–4.500)

## 2019-01-07 MED ORDER — SODIUM CHLORIDE 0.9 % IV BOLUS
1000.0000 mL | Freq: Once | INTRAVENOUS | Status: AC
  Administered 2019-01-07: 1000 mL via INTRAVENOUS

## 2019-01-07 MED ORDER — LORAZEPAM 1 MG PO TABS: 1.0000 mg | ORAL_TABLET | Freq: Every day | ORAL | 0 refills | Status: AC | PRN

## 2019-01-07 MED ORDER — LORAZEPAM 2 MG/ML IJ SOLN
1.0000 mg | Freq: Once | INTRAMUSCULAR | Status: AC
  Administered 2019-01-07: 1 mg via INTRAVENOUS
  Filled 2019-01-07: qty 1

## 2019-01-07 NOTE — ED Triage Notes (Signed)
Here for palpitations and fast HR. Denies pain but has had heaviness in left arm mostly. On and off since Monday. victoza was new and it was stopped Monday in case the cause but has still had symptoms

## 2019-01-07 NOTE — ED Notes (Signed)
Pt signed for d/c but topaz froze and wouldn't allow for it to be printed. Pt wheeled out to car.

## 2019-01-07 NOTE — ED Provider Notes (Signed)
[facility] Emergency Department Provider Note  ____________________________________________   I have reviewed the triage vital signs and the nursing notes.   HISTORY  Chief Complaint Palpitations   History limited by: Not Limited   HPI Susan Kramer is a 49 y.o. female who presents to the emergency department today because of concerns for fast heart rate.  Patient states that she has had multiple episodes of feeling like her heart is been racing since starting Victoza.  She did stop the Victoza 3 days ago however has not noticed any decrease in the frequency of these episodes.  She is having 3-4 a day.  The patient has some discomfort and heaviness and she does states she feels the heaviness in her left arm as well.  Patient has not noticed any pattern to the episodes.  They do not seem to be made worse by exertion or eating.  Prior to starting Victoza she states that she might of had similar symptoms 1 time in the past roughly 5 years ago. She denies any fevers or recent illness.   Records reviewed. Per medical record review patient has a history of diabetes, HLD, HTN.   Past Medical History:  Diagnosis Date  . Diabetes ([place])   . High blood pressure   . High cholesterol     Patient Active Problem List   Diagnosis Date Noted  . Mass of parotid gland 11/21/2014  . Multiple lung nodules on CT 10/23/2014  . Abnormal PFT 10/22/2013  . Cough 09/27/2013    Past Surgical History:  Procedure Laterality Date  . BREAST REDUCTION SURGERY     12 years ago  . CHOLECYSTECTOMY  2009    Prior to Admission medications   Medication Sig Start Date End Date Taking? Authorizing Provider  amLODipine (NORVASC) 10 MG tablet Take 1 tablet by mouth daily. 01/28/14 01/28/15  [provider]  amLODipine (NORVASC) 10 MG tablet  07/27/15   [provider]  benzonatate (TESSALON) 200 MG capsule Take 1 capsule (200 mg total) by mouth 3 (three) times daily as  needed for cough. 09/22/15   Fisher, Linden Dolin, PA-C  cefdinir (OMNICEF) 300 MG capsule Take 1 capsule (300 mg total) by mouth 2 (two) times daily. 09/22/15   Fisher, Linden Dolin, PA-C  dicyclomine (BENTYL) 10 MG capsule Take 1 capsule (10 mg total) by mouth 4 (four) times daily -  before meals and at bedtime. 10/18/14   Robert Bellow, MD  fluconazole (DIFLUCAN) 150 MG tablet Take 1 tablet (150 mg total) by mouth once. 09/22/15   Fisher, Linden Dolin, PA-C  glimepiride (AMARYL) 2 MG tablet Take 2 mg by mouth 2 (two) times daily.    [provider]  hydrochlorothiazide (HYDRODIURIL) 25 MG tablet Take 1 tablet by mouth daily. 03/30/14 03/30/15  [provider]  hydrochlorothiazide (HYDRODIURIL) 25 MG tablet  07/27/15   [provider]  metFORMIN (GLUCOPHAGE) 1000 MG tablet Take 1,000 mg by mouth 2 (two) times daily with a meal.    [provider]  pantoprazole (PROTONIX) 40 MG tablet Take 40 mg by mouth 2 (two) times daily.    [provider]  predniSONE (DELTASONE) 10 MG tablet Take 3 tablets (30 mg total) by mouth daily with breakfast. 09/22/15   Caryn Section Linden Dolin, PA-C    Allergies Patient has no known allergies.  Family History  Problem Relation Age of Onset  . Diabetes Mother   . Colon polyps Father     Social  History Social History   Tobacco Use  . Smoking status: Never Smoker  . Smokeless tobacco: Never Used  Substance Use Topics  . Alcohol use: Yes    Alcohol/week: 0.0 standard drinks    Comment: occasional wine  . Drug use: No    Review of Systems Constitutional: No fever/chills Eyes: No visual changes. ENT: No sore throat. Cardiovascular: Positive for palpitations. Respiratory: Denies shortness of breath. Gastrointestinal: No abdominal pain.  No nausea, no vomiting.  No diarrhea.   Genitourinary: Negative for dysuria. Musculoskeletal: Positive for left arm heaviness.  Skin: Negative for rash. Neurological: Negative for headaches, focal  weakness or numbness.  ____________________________________________   PHYSICAL EXAM:  VITAL SIGNS: ED Triage Vitals  Enc Vitals Group     BP 01/07/19 1413 (!) 208/99     Pulse Rate 01/07/19 1413 (!) 117     Resp 01/07/19 1413 18     Temp 01/07/19 1413 98.2 F (36.8 C)     Temp Source 01/07/19 1413 Oral     SpO2 01/07/19 1413 99 %     Weight 01/07/19 1411 250 lb (113.4 kg)     Height 01/07/19 1411 5\' 9"  (1.753 m)     Head Circumference --      Peak Flow --      Pain Score 01/07/19 1408 0   Constitutional: Alert and oriented.  Eyes: Conjunctivae are normal.  ENT      Head: Normocephalic and atraumatic.      Nose: No congestion/rhinnorhea.      Mouth/Throat: Mucous membranes are moist.      Neck: No stridor. Hematological/Lymphatic/Immunilogical: No cervical lymphadenopathy. Cardiovascular: Tachycardia, regular rhythm.  No murmurs, rubs, or gallops.  Respiratory: Normal respiratory effort without tachypnea nor retractions. Breath sounds are clear and equal bilaterally. No wheezes/rales/rhonchi. Gastrointestinal: Soft and non tender. No rebound. No guarding.  Genitourinary: Deferred Musculoskeletal: Normal range of motion in all extremities. No lower extremity edema. Neurologic:  Normal speech and language. No gross focal neurologic deficits are appreciated.  Skin:  Skin is warm, dry and intact. No rash noted. Psychiatric: Mood and affect are normal. Speech and behavior are normal. Patient exhibits appropriate insight and judgment.  ____________________________________________    LABS (pertinent positives/negatives)  BMP wnl except glu 177 CBC wbc 7.8, hgb 14.5, plt 379 Trop <0.03  ____________________________________________   EKG  I, Nance Pear, attending physician, personally viewed and interpreted this EKG  EKG Time: 1411 Rate: 116 Rhythm: sinus tachycardia Axis: normal Intervals: qtc 466 QRS: narrow, q waves V1, V2 ST changes: no st  elevation Impression: abnormal ekg   ____________________________________________    RADIOLOGY  CXR No active disease. Pulmonary nodule again noted.  ____________________________________________   PROCEDURES  Procedures  ____________________________________________   INITIAL IMPRESSION / ASSESSMENT AND PLAN / ED COURSE  Pertinent labs & imaging results that were available during my care of the patient were reviewed by me and considered in my medical decision making (see chart for details).   Patient presented to the emergency department today because of concern for fast heart rate. Ddx would be broad including dehydration, infection, medication side effect, anxiety, thyroid issue amongst other etiologies. EKG shows tachycardia without concerning morphology. CXR negative. Initial blood work within normal limits. Will give IVFs and ativan. Will sign out to oncoming provider.   ____________________________________________   FINAL CLINICAL IMPRESSION(S) / ED DIAGNOSES  Final diagnoses:  Palpitations     Note: This dictation was prepared with Dragon dictation. Any transcriptional errors that result  from this process are unintentional     Nance Pear, MD 01/08/19 1521

## 2019-01-14 DIAGNOSIS — E118 Type 2 diabetes mellitus with unspecified complications: Secondary | ICD-10-CM | POA: Diagnosis not present

## 2019-01-14 DIAGNOSIS — E785 Hyperlipidemia, unspecified: Secondary | ICD-10-CM | POA: Diagnosis not present

## 2019-01-14 DIAGNOSIS — I10 Essential (primary) hypertension: Secondary | ICD-10-CM | POA: Diagnosis not present

## 2019-01-14 DIAGNOSIS — R601 Generalized edema: Secondary | ICD-10-CM | POA: Diagnosis not present

## 2019-06-17 DIAGNOSIS — E1165 Type 2 diabetes mellitus with hyperglycemia: Secondary | ICD-10-CM | POA: Diagnosis not present

## 2019-06-17 DIAGNOSIS — E118 Type 2 diabetes mellitus with unspecified complications: Secondary | ICD-10-CM | POA: Diagnosis not present

## 2019-06-17 DIAGNOSIS — E785 Hyperlipidemia, unspecified: Secondary | ICD-10-CM | POA: Diagnosis not present

## 2019-06-24 DIAGNOSIS — R801 Persistent proteinuria, unspecified: Secondary | ICD-10-CM | POA: Diagnosis not present

## 2019-06-24 DIAGNOSIS — Z23 Encounter for immunization: Secondary | ICD-10-CM | POA: Diagnosis not present

## 2019-06-24 DIAGNOSIS — Z0000 Encounter for general adult medical examination without abnormal findings: Secondary | ICD-10-CM | POA: Diagnosis not present

## 2019-06-24 DIAGNOSIS — E118 Type 2 diabetes mellitus with unspecified complications: Secondary | ICD-10-CM | POA: Diagnosis not present

## 2019-06-24 DIAGNOSIS — I10 Essential (primary) hypertension: Secondary | ICD-10-CM | POA: Diagnosis not present

## 2020-01-12 ENCOUNTER — Other Ambulatory Visit: Payer: Self-pay | Admitting: Internal Medicine

## 2020-06-09 ENCOUNTER — Other Ambulatory Visit: Payer: Self-pay | Admitting: Internal Medicine

## 2020-06-09 DIAGNOSIS — R801 Persistent proteinuria, unspecified: Secondary | ICD-10-CM | POA: Diagnosis not present

## 2020-06-09 DIAGNOSIS — E1165 Type 2 diabetes mellitus with hyperglycemia: Secondary | ICD-10-CM | POA: Diagnosis not present

## 2020-06-09 DIAGNOSIS — Z6841 Body Mass Index (BMI) 40.0 and over, adult: Secondary | ICD-10-CM | POA: Diagnosis not present

## 2020-06-09 DIAGNOSIS — I10 Essential (primary) hypertension: Secondary | ICD-10-CM | POA: Diagnosis not present

## 2020-06-09 DIAGNOSIS — E785 Hyperlipidemia, unspecified: Secondary | ICD-10-CM | POA: Diagnosis not present

## 2020-06-09 DIAGNOSIS — Z1211 Encounter for screening for malignant neoplasm of colon: Secondary | ICD-10-CM | POA: Diagnosis not present

## 2020-06-14 ENCOUNTER — Other Ambulatory Visit: Payer: Self-pay | Admitting: Internal Medicine

## 2020-06-29 ENCOUNTER — Other Ambulatory Visit: Payer: Self-pay | Admitting: Internal Medicine

## 2020-08-03 DIAGNOSIS — R102 Pelvic and perineal pain: Secondary | ICD-10-CM | POA: Diagnosis not present

## 2020-08-03 DIAGNOSIS — Z01419 Encounter for gynecological examination (general) (routine) without abnormal findings: Secondary | ICD-10-CM | POA: Diagnosis not present

## 2020-08-03 DIAGNOSIS — Z1231 Encounter for screening mammogram for malignant neoplasm of breast: Secondary | ICD-10-CM | POA: Diagnosis not present

## 2020-08-03 DIAGNOSIS — Z86018 Personal history of other benign neoplasm: Secondary | ICD-10-CM | POA: Diagnosis not present

## 2020-08-04 ENCOUNTER — Other Ambulatory Visit: Payer: Self-pay | Admitting: Obstetrics and Gynecology

## 2020-08-04 DIAGNOSIS — Z1231 Encounter for screening mammogram for malignant neoplasm of breast: Secondary | ICD-10-CM

## 2020-08-08 ENCOUNTER — Other Ambulatory Visit: Payer: Self-pay | Admitting: Internal Medicine

## 2020-09-03 ENCOUNTER — Telehealth: Payer: 59 | Admitting: Family

## 2020-09-03 DIAGNOSIS — R059 Cough, unspecified: Secondary | ICD-10-CM

## 2020-09-03 MED ORDER — BENZONATATE 100 MG PO CAPS: 100.0000 mg | ORAL_CAPSULE | Freq: Three times a day (TID) | ORAL | 0 refills | Status: DC | PRN

## 2020-09-03 MED ORDER — PREDNISONE 10 MG (21) PO TBPK: ORAL_TABLET | ORAL | 0 refills | Status: DC

## 2020-09-03 NOTE — Progress Notes (Signed)

## 2020-09-06 ENCOUNTER — Telehealth: Payer: 59 | Admitting: Nurse Practitioner

## 2020-09-06 ENCOUNTER — Other Ambulatory Visit: Payer: Self-pay | Admitting: Emergency Medicine

## 2020-09-06 DIAGNOSIS — R059 Cough, unspecified: Secondary | ICD-10-CM

## 2020-09-06 NOTE — Progress Notes (Signed)
We are sorry that you are not feeling well.  Here is how we plan to help!  Based on your presentation I believe you most likely have A cough due to a virus.  This is called viral bronchitis and is best treated by rest, plenty of fluids and control of the cough.  You may use Ibuprofen or Tylenol as directed to help your symptoms.     Antibiotics are used to treat bacterial infections. Your cough sounds more viral and can last up to 10 -14 days.  Providers prescribe antibiotics to treat infections caused by bacteria. Antibiotics are very powerful in treating bacterial infections when they are used properly. To maintain their effectiveness, they should be used only when necessary. Overuse of antibiotics has resulted in the development of superbugs that are resistant to treatment!    After careful review of your answers, I would not recommend an antibiotic for your condition.  Antibiotics are not effective against viruses and therefore should not be used to treat them. Common examples of infections caused by viruses include colds and flu   In addition you may use A non-prescription cough medication called Mucinex DM: take 2 tablets every 12 hours.   From your responses in the eVisit questionnaire you describe inflammation in the upper respiratory tract which is causing a significant cough.  This is commonly called Bronchitis and has four common causes:    Allergies  Viral Infections  Acid Reflux  Bacterial Infection Allergies, viruses and acid reflux are treated by controlling symptoms or eliminating the cause. An example might be a cough caused by taking certain blood pressure medications. You stop the cough by changing the medication. Another example might be a cough caused by acid reflux. Controlling the reflux helps control the cough.  USE OF BRONCHODILATOR ("RESCUE") INHALERS: There is a risk from using your bronchodilator too frequently.  The risk is that over-reliance on a medication  which only relaxes the muscles surrounding the breathing tubes can reduce the effectiveness of medications prescribed to reduce swelling and congestion of the tubes themselves.  Although you feel brief relief from the bronchodilator inhaler, your asthma may actually be worsening with the tubes becoming more swollen and filled with mucus.  This can delay other crucial treatments, such as oral steroid medications. If you need to use a bronchodilator inhaler daily, several times per day, you should discuss this with your provider.  There are probably better treatments that could be used to keep your asthma under control.     HOME CARE . Only take medications as instructed by your medical team. . Complete the entire course of an antibiotic. . Drink plenty of fluids and get plenty of rest. . Avoid close contacts especially the very young and the elderly . Cover your mouth if you cough or cough into your sleeve. . Always remember to wash your hands . A steam or ultrasonic humidifier can help congestion.   GET HELP RIGHT AWAY IF: . You develop worsening fever. . You become short of breath . You cough up blood. . Your symptoms persist after you have completed your treatment plan MAKE SURE YOU   Understand these instructions.  Will watch your condition.  Will get help right away if you are not doing well or get worse.  Your e-visit answers were reviewed by a board certified advanced clinical practitioner to complete your personal care plan.  Depending on the condition, your plan could have included both over the counter or prescription medications.  If there is a problem please reply  once you have received a response from your provider. Your safety is important to [place].  If you have drug allergies check your prescription carefully.    You can use MyChart to ask questions about today's visit, request a non-urgent call back, or ask for a work or school excuse for 24 hours related to this e-Visit. If  it has been greater than 24 hours you will need to follow up with your provider, or enter a new e-Visit to address those concerns. You will get an e-mail in the next two days asking about your experience.  I hope that your e-visit has been valuable and will speed your recovery. Thank you for using e-visits.  5-10 minutes spent reviewing and documenting in chart.

## 2020-09-07 ENCOUNTER — Other Ambulatory Visit: Payer: Self-pay | Admitting: Internal Medicine

## 2020-09-14 ENCOUNTER — Other Ambulatory Visit: Payer: Self-pay

## 2020-09-14 ENCOUNTER — Ambulatory Visit
Admission: RE | Admit: 2020-09-14 | Discharge: 2020-09-14 | Disposition: A | Discharge status: Home / Self Care | Payer: 59 | Source: Ambulatory Visit | Attending: Obstetrics and Gynecology | Admitting: Obstetrics and Gynecology

## 2020-09-14 DIAGNOSIS — Z1231 Encounter for screening mammogram for malignant neoplasm of breast: Secondary | ICD-10-CM | POA: Insufficient documentation

## 2020-10-12 DIAGNOSIS — N83292 Other ovarian cyst, left side: Secondary | ICD-10-CM | POA: Diagnosis not present

## 2020-10-12 DIAGNOSIS — N83291 Other ovarian cyst, right side: Secondary | ICD-10-CM | POA: Diagnosis not present

## 2020-10-12 DIAGNOSIS — R100 Acute abdomen: Secondary | ICD-10-CM | POA: Diagnosis not present

## 2020-10-12 DIAGNOSIS — R102 Pelvic and perineal pain: Secondary | ICD-10-CM | POA: Diagnosis not present

## 2020-10-12 DIAGNOSIS — Z86018 Personal history of other benign neoplasm: Secondary | ICD-10-CM | POA: Diagnosis not present

## 2020-10-25 ENCOUNTER — Other Ambulatory Visit: Payer: Self-pay | Admitting: Obstetrics and Gynecology

## 2020-11-14 DIAGNOSIS — Z01818 Encounter for other preprocedural examination: Secondary | ICD-10-CM | POA: Diagnosis not present

## 2020-11-14 DIAGNOSIS — N800 Endometriosis of uterus: Secondary | ICD-10-CM | POA: Diagnosis not present

## 2020-11-14 DIAGNOSIS — N809 Endometriosis, unspecified: Secondary | ICD-10-CM | POA: Diagnosis not present

## 2020-11-14 DIAGNOSIS — R102 Pelvic and perineal pain: Secondary | ICD-10-CM | POA: Diagnosis not present

## 2020-11-14 NOTE — H&P (Signed)
Susan Kramer is a 50 y.o. female presenting with Pre Op Consulting   History of Present Illness: Patient presents for a preoperative visit for Robotically-Assisted Total Laparoscopic Hysterectomy with Bilateral Salpingectomy, Bilateral Ovarian Cystectomy. Surgical indications: pelvic pain, pelvic pressure, endometriosis and adenomyosis both dx by prior laparoscopy.  Patient has bilateral complex ovarian cysts on recent ultrasound. Discussed CA125 for completeness/rule out ovarian malignancy. Discussed follow up ultrasound in 6 months, discussed definitive management with hysterectomy, which pt prefers at this point.  Workup: Pap 07/2020: neg/neg EMBx: collected today  TVUS 09/2020:  Uterusanteflexed=6.30 x 3.53 x 5.28cm Endometrium=3.42mm Rt complex ovarian cyst=2.2cm    Lt complex ovarian cyst=2.5cm No free fluid seen  Pertinent Hx: -Hx of cholecystectomy -1 vaginal delivery  Past Medical History:  has a past medical history of Atypical chest pain (06/2010), Diabetes mellitus type 2, uncomplicated (CMS-HCC) (48/1856), Edema (03/28/2014), GERD (gastroesophageal reflux disease), Hyperlipidemia, and Hypertension.  Past Surgical History:  has a past surgical history that includes Reduction mammaplasty; Cholecystectomy (1998); and Dilation and curettage of uterus. Family History: family history includes Diabetes type II in her maternal grandmother; High blood pressure (Hypertension) in her maternal grandmother; Kidney failure in her cousin. Social History:  reports that she has never smoked. She has never used smokeless tobacco. She reports that she does not drink alcohol and does not use drugs. OB/GYN History:          OB History    Gravida  2   Para  1   Term  1   Preterm      AB  1   Living  1     SAB      IAB      Ectopic  1   Molar      Multiple      Live Births           Allergies: is allergic to victoza [liraglutide]. Medications: Current Outpatient  Medications:  .  albuterol 90 mcg/actuation inhaler, , Disp: , Rfl:  .  atorvastatin (LIPITOR) 40 MG tablet, TAKE 1 TABLET (40 MG TOTAL) BY MOUTH ONCE DAILY, Disp: 90 tablet, Rfl: 4 .  chlorpheniramine-phenylephrine 4-10 mg per tablet, Take by mouth, Disp: , Rfl:  .  dicyclomine (BENTYL) 10 mg capsule, TAKE 1 CAPSULE BY MOUTH 3 TIMES DAILY., Disp: 270 capsule, Rfl: 3 .  empagliflozin (JARDIANCE) 25 mg tablet, Take 1 tablet (25 mg total) by mouth daily with breakfast, Disp: 30 tablet, Rfl: 11 .  glimepiride (AMARYL) 4 MG tablet, TAKE 2 TABLETS BY MOUTH DAILY WITH BREAKFAST, Disp: 180 tablet, Rfl: 1 .  hydroCHLOROthiazide (HYDRODIURIL) 25 MG tablet, TAKE 1 TABLET (25 MG TOTAL) BY MOUTH ONCE DAILY., Disp: 30 tablet, Rfl: 11 .  INVOKANA 300 mg tablet, TAKE 1 TABLET BY MOUTH ONCE DAILY, Disp: 85 tablet, Rfl: 3 .  metFORMIN (GLUCOPHAGE) 1000 MG tablet, Take 1 tablet (1,000 mg total) by mouth 2 (two) times daily, Disp: 180 tablet, Rfl: 3 .  omeprazole (PRILOSEC) 20 MG DR capsule, TAKE 1 CAPSULE BY MOUTH 2 TIMES DAILY., Disp: 180 capsule, Rfl: 3 .  telmisartan (MICARDIS) 40 MG tablet, TAKE 1 TABLET BY MOUTH ONCE DAILY., Disp: 30 tablet, Rfl: 11  Review of Systems: No SOB, no palpitations or chest pain, no new lower extremity edema, no nausea or vomiting or bowel or bladder complaints. See HPI for gyn specific ROS.   Exam:   BP 134/70   Ht 182.9 cm (6')   Wt (!) 112 kg (247 lb)  LMP 04/07/2017 (Exact Date)   BMI 33.50 kg/m   Constitutional:  General appearance: Well nourished, well developed female in no acute distress.  Neuro/psych:  Normal mood and affect. No gross motor deficits. CV: RRR Pulm: CTAB  Neck:  Supple, normal appearance.  Respiratory:  Normal respiratory effort, no use of accessory muscles Skin:  No visible rashes or external lesions  Pelvic: limited by boy habitus              External genitalia: vulva and labia small external nevus, palpable, Tanner stage 5              Urethra: no prolapse             Vagina: normal physiologic d/c             Cervix: no lesions, no cervical motion tenderness               Uterus: normal size shape and contour, non-tender             Adnexa: no mass,  non-tender               Rectovaginal: external exam normal Endometrial biopsy: The cervix was cleaned with betadine, topical Hurriciane spray applied, and a single tooth tenaculum is applied to the anterior cervix. The Pipelle catheter was placed into the endometrial cavity. It sounds to 8 cm and adequate tissue was removed.  Impression:   The primary encounter diagnosis was Visit for pre-operative examination. Diagnoses of Pelvic pain in female, Endometriosis, and Adenomyosis were also pertinent to this visit.  Plan:   1. Pelvic pain, pelvic pressure, endometriosis and adenomyosis both dx by prior laparoscopy -Endometrial biopsy collected today for pre-op work up completion. -Patient returns for a preoperative discussion regarding her plans to proceed with surgical treatment of her pelvic pain, pelvic pressure, endometriosis, and adenomyosis both dx by prior laparoscopy by [place] with Bilateral Ovarian Cystectomy procedure. We may perform a cystoscopy to evaluate the urinary tract after the procedure, if surgically indicated for uro tract integrity.   The patient and I discussed the technical aspects of the procedure including the potential for risks and complications. These include but are not limited to the risk of infection requiring post-operative antibiotics or further procedures. We talked about the risk of injury to adjacent organs including bladder, bowel, ureter, blood vessels or nerves. We talked about the need to convert to an open incision. We talked about the possible need for blood transfusion. We talked aboutpostop complications such asthromboembolic or cardiopulmonary complications. All of her questions were answered.  Her preoperative exam was  completed and the appropriate consents were signed. She is scheduled to undergo this procedure in the near future.  -Will add CA125 lab to pre-op orders  Specific Peri-operative Considerations:  - Consent: obtained today - Health Maintenance:EMBx today, pap up to date - Labs: CBC, CMP preoperatively - Studies: EKG, CXR preoperatively - Bowel Preparation: None required - Abx:  Ancef 2g - VTE ppx: SCDs perioperatively  - Beta-blockade: n/a  Diagnoses and all orders for this visit:  Visit for pre-operative examination -     Pathology Report - [facility]  Pelvic pain in female -     Pathology Report - [facility]  Endometriosis -     Pathology Report - [facility]  Adenomyosis -     Pathology Report - [facility]

## 2020-11-21 ENCOUNTER — Other Ambulatory Visit: Payer: 59

## 2020-11-23 ENCOUNTER — Other Ambulatory Visit
Admission: RE | Admit: 2020-11-23 | Discharge: 2020-11-23 | Disposition: A | Discharge status: Home / Self Care | Payer: 59 | Source: Ambulatory Visit | Attending: Obstetrics and Gynecology | Admitting: Obstetrics and Gynecology

## 2020-11-23 ENCOUNTER — Other Ambulatory Visit: Payer: Self-pay

## 2020-11-23 DIAGNOSIS — I10 Essential (primary) hypertension: Secondary | ICD-10-CM | POA: Insufficient documentation

## 2020-11-23 DIAGNOSIS — Z01818 Encounter for other preprocedural examination: Secondary | ICD-10-CM | POA: Insufficient documentation

## 2020-11-23 HISTORY — DX: Gastro-esophageal reflux disease without esophagitis: K21.9

## 2020-11-23 LAB — BASIC METABOLIC PANEL
Anion gap: 13 (ref 5–15)
BUN: 11 mg/dL (ref 6–20)
CO2: 24 mmol/L (ref 22–32)
Calcium: 9.5 mg/dL (ref 8.9–10.3)
Chloride: 100 mmol/L (ref 98–111)
Creatinine, Ser: 0.68 mg/dL (ref 0.44–1.00)
GFR, Estimated: >60 mL/min (ref >60)
Glucose, Bld: 211 mg/dL — ABNORMAL HIGH (ref 70–99)
Potassium: 3.9 mmol/L (ref 3.5–5.1)
Sodium: 137 mmol/L (ref 135–145)

## 2020-11-23 LAB — CBC
HCT: 43.3 % (ref 36.0–46.0)
Hemoglobin: 13.9 g/dL (ref 12.0–15.0)
MCH: 26.5 pg (ref 26.0–34.0)
MCHC: 32.1 g/dL (ref 30.0–36.0)
MCV: 82.6 fL (ref 80.0–100.0)
Platelets: 318 10*3/uL (ref 150–400)
RBC: 5.24 MIL/uL — ABNORMAL HIGH (ref 3.87–5.11)
RDW: 13.8 % (ref 11.5–15.5)
WBC: 6.5 10*3/uL (ref 4.0–10.5)
nRBC: 0 % (ref 0.0–0.2)

## 2020-11-23 LAB — TYPE AND SCREEN
ABO/RH(D): A POS
Antibody Screen: NEGATIVE

## 2020-11-23 NOTE — Patient Instructions (Signed)
Your procedure is scheduled on: Friday December 01, 2020. Report to Day Surgery inside [facility] 2nd floor (stop by admissions desk first before getting on elevator). To find out your arrival time please call [phone] between 1PM - 3PM on Thursday November 30, 2020.  Remember: Instructions that are not followed completely may result in serious medical risk,  up to and including death, or upon the discretion of your surgeon and anesthesiologist your  surgery may need to be rescheduled.     _X__ 1. Do not eat food after midnight the night before your procedure.                 No chewing gum or hard candies. You may drink clear liquids up to 2 hours                 before you are scheduled to arrive for your surgery- DO not drink clear                 liquids within 2 hours of the start of your surgery.                 Clear Liquids include:  water, apple juice without pulp, clear Gatorade, G2 or                  Gatorade Zero (avoid Red/Purple/Blue), Black Coffee or Tea (Do not add                 anything to coffee or tea).  __X__2.  On the morning of surgery brush your teeth with toothpaste and water, you                may rinse your mouth with mouthwash if you wish.  Do not swallow any toothpaste of mouthwash.     _X__ 3.  No Alcohol for 24 hours before or after surgery.   _X__ 4.  Do Not Smoke or use e-cigarettes For 24 Hours Prior to Your Surgery.                 Do not use any chewable tobacco products for at least 6 hours prior to                 Surgery.  _X__  5.  Do not use any recreational drugs (marijuana, cocaine, heroin, ecstasy, MDMA or other)                For at least one week prior to your surgery.  Combination of these drugs with anesthesia                May have life threatening results.  __X__6.  Notify your doctor if there is any change in your medical condition      (cold, fever, infections).     Do not wear jewelry, make-up, hairpins,  clips or nail polish. Do not wear lotions, powders, or perfumes. You may wear deodorant. Do not shave 48 hours prior to surgery.  Do not bring valuables to the hospital.    [facility] is not responsible for any belongings or valuables.  Contacts, dentures or bridgework may not be worn into surgery. Leave your suitcase in the car. After surgery it may be brought to your room. For patients admitted to the hospital, discharge time is determined by your treatment team.   Patients discharged the day of surgery will not be allowed to drive home.   Make arrangements for  someone to be with you for the first 24 hours of your Same Day Discharge.   __X__ Take these medicines the morning of surgery with A SIP OF WATER:    1. omeprazole (PRILOSEC) 20 MG   2.   3.   4.  5.  6.  ____ Fleet Enema (as directed)   ____ Use CHG Soap (or wipes) as directed  ____ Use Benzoyl Peroxide Gel as instructed  ____ Use inhalers on the day of surgery  __X__ Stop metFORMIN (GLUCOPHAGE) 1000 MG  2 days prior to surgery (last dose 11/28/20)    ____ Take 1/2 of usual insulin dose the night before surgery. No insulin the morning          of surgery.   ____ Stop Coumadin/Plavix/aspirin on   __X__ Stop Anti-inflammatories such Ibuprofen, Aleve, Advil, naproxen, meloxicam, aspirin, BC powders and Goodsy powders.    __X__ Stop supplements until after surgery.    __X__ Do not start any herbal supplements before your procedure.    If you have any questions regarding your pre-procedure instructions,  Please call Pre-admit Testing at [phone].

## 2020-11-25 ENCOUNTER — Other Ambulatory Visit: Payer: Self-pay

## 2020-11-25 MED FILL — Canagliflozin Tab 300 MG: ORAL | 90 days supply | Qty: 90 | Fill #0 | Status: AC

## 2020-11-29 ENCOUNTER — Other Ambulatory Visit
Admission: RE | Admit: 2020-11-29 | Discharge: 2020-11-29 | Disposition: A | Discharge status: Home / Self Care | Payer: 59 | Source: Ambulatory Visit | Attending: Obstetrics and Gynecology | Admitting: Obstetrics and Gynecology

## 2020-11-29 ENCOUNTER — Other Ambulatory Visit: Payer: Self-pay

## 2020-11-29 DIAGNOSIS — Z20822 Contact with and (suspected) exposure to covid-19: Secondary | ICD-10-CM | POA: Diagnosis not present

## 2020-11-29 DIAGNOSIS — Z01812 Encounter for preprocedural laboratory examination: Secondary | ICD-10-CM | POA: Diagnosis not present

## 2020-11-29 LAB — SARS CORONAVIRUS 2 (TAT 6-24 HRS): SARS Coronavirus 2: NEGATIVE

## 2020-12-01 ENCOUNTER — Encounter
Admission: RE | Disposition: A | Discharge status: Home / Self Care | Payer: Self-pay | Source: Home / Self Care | Attending: Obstetrics and Gynecology

## 2020-12-01 ENCOUNTER — Ambulatory Visit: Payer: 59 | Admitting: Anesthesiology

## 2020-12-01 ENCOUNTER — Other Ambulatory Visit: Payer: Self-pay

## 2020-12-01 ENCOUNTER — Encounter: Payer: Self-pay | Admitting: Obstetrics and Gynecology

## 2020-12-01 ENCOUNTER — Ambulatory Visit
Admission: RE | Admit: 2020-12-01 | Discharge: 2020-12-01 | Disposition: A | Discharge status: Home / Self Care | Payer: 59 | Attending: Obstetrics and Gynecology | Admitting: Obstetrics and Gynecology

## 2020-12-01 ENCOUNTER — Ambulatory Visit: Payer: 59 | Admitting: Urgent Care

## 2020-12-01 DIAGNOSIS — Z888 Allergy status to other drugs, medicaments and biological substances status: Secondary | ICD-10-CM | POA: Insufficient documentation

## 2020-12-01 DIAGNOSIS — R102 Pelvic and perineal pain: Secondary | ICD-10-CM | POA: Diagnosis not present

## 2020-12-01 DIAGNOSIS — N843 Polyp of vulva: Secondary | ICD-10-CM | POA: Insufficient documentation

## 2020-12-01 DIAGNOSIS — E119 Type 2 diabetes mellitus without complications: Secondary | ICD-10-CM | POA: Diagnosis not present

## 2020-12-01 DIAGNOSIS — D251 Intramural leiomyoma of uterus: Secondary | ICD-10-CM | POA: Diagnosis not present

## 2020-12-01 DIAGNOSIS — N72 Inflammatory disease of cervix uteri: Secondary | ICD-10-CM | POA: Insufficient documentation

## 2020-12-01 DIAGNOSIS — Z79899 Other long term (current) drug therapy: Secondary | ICD-10-CM | POA: Insufficient documentation

## 2020-12-01 DIAGNOSIS — L821 Other seborrheic keratosis: Secondary | ICD-10-CM | POA: Diagnosis not present

## 2020-12-01 DIAGNOSIS — D252 Subserosal leiomyoma of uterus: Secondary | ICD-10-CM | POA: Insufficient documentation

## 2020-12-01 DIAGNOSIS — Z7984 Long term (current) use of oral hypoglycemic drugs: Secondary | ICD-10-CM | POA: Insufficient documentation

## 2020-12-01 DIAGNOSIS — R918 Other nonspecific abnormal finding of lung field: Secondary | ICD-10-CM | POA: Diagnosis not present

## 2020-12-01 DIAGNOSIS — N800 Endometriosis of uterus: Secondary | ICD-10-CM | POA: Insufficient documentation

## 2020-12-01 HISTORY — PX: VULVAR LESION REMOVAL: SHX5391

## 2020-12-01 HISTORY — PX: CYSTOSCOPY: SHX5120

## 2020-12-01 HISTORY — PX: ROBOTIC ASSISTED TOTAL HYSTERECTOMY WITH BILATERAL SALPINGO OOPHERECTOMY: SHX6086

## 2020-12-01 HISTORY — PX: LAPAROSCOPIC OVARIAN CYSTECTOMY: SHX6248

## 2020-12-01 LAB — ABO/RH: ABO/RH(D): A POS

## 2020-12-01 LAB — GLUCOSE, CAPILLARY
Glucose-Capillary: 150 mg/dL — ABNORMAL HIGH (ref 70–99)
Glucose-Capillary: 247 mg/dL — ABNORMAL HIGH (ref 70–99)

## 2020-12-01 LAB — POCT PREGNANCY, URINE: Preg Test, Ur: NEGATIVE

## 2020-12-01 SURGERY — HYSTERECTOMY, TOTAL, ROBOT-ASSISTED, LAPAROSCOPIC, WITH BILATERAL SALPINGO-OOPHORECTOMY
Anesthesia: General

## 2020-12-01 MED ORDER — FENTANYL CITRATE (PF) 100 MCG/2ML IJ SOLN
INTRAMUSCULAR | Status: AC
  Administered 2020-12-01: 25 ug via INTRAVENOUS
  Filled 2020-12-01: qty 2

## 2020-12-01 MED ORDER — CEFAZOLIN SODIUM-DEXTROSE 2-4 GM/100ML-% IV SOLN
2.0000 g | INTRAVENOUS | Status: AC
  Administered 2020-12-01: 2 g via INTRAVENOUS

## 2020-12-01 MED ORDER — CHLORHEXIDINE GLUCONATE 0.12 % MT SOLN
OROMUCOSAL | Status: AC
  Filled 2020-12-01: qty 15

## 2020-12-01 MED ORDER — SODIUM CHLORIDE 0.9 % IV SOLN: INTRAVENOUS | Status: DC

## 2020-12-01 MED ORDER — GABAPENTIN 800 MG PO TABS
800.0000 mg | ORAL_TABLET | Freq: Every day | ORAL | 0 refills | Status: AC
  Filled 2020-12-01: qty 14, 14d supply, fill #0

## 2020-12-01 MED ORDER — DOCUSATE SODIUM 100 MG PO CAPS
100.0000 mg | ORAL_CAPSULE | Freq: Two times a day (BID) | ORAL | 0 refills | Status: AC
  Filled 2020-12-01: qty 30, 15d supply, fill #0

## 2020-12-01 MED ORDER — ONDANSETRON HCL 4 MG/2ML IJ SOLN
INTRAMUSCULAR | Status: AC
  Filled 2020-12-01: qty 2

## 2020-12-01 MED ORDER — MIDAZOLAM HCL 2 MG/2ML IJ SOLN
INTRAMUSCULAR | Status: DC | PRN
  Administered 2020-12-01: 2 mg via INTRAVENOUS

## 2020-12-01 MED ORDER — DEXAMETHASONE SODIUM PHOSPHATE 10 MG/ML IJ SOLN
INTRAMUSCULAR | Status: DC | PRN
  Administered 2020-12-01: 5 mg via INTRAVENOUS

## 2020-12-01 MED ORDER — FENTANYL CITRATE (PF) 100 MCG/2ML IJ SOLN
INTRAMUSCULAR | Status: AC
  Filled 2020-12-01: qty 2

## 2020-12-01 MED ORDER — KETOROLAC TROMETHAMINE 30 MG/ML IJ SOLN
INTRAMUSCULAR | Status: DC | PRN
  Administered 2020-12-01: 30 mg via INTRAVENOUS

## 2020-12-01 MED ORDER — OXYCODONE HCL 5 MG PO TABS
ORAL_TABLET | ORAL | Status: AC
  Administered 2020-12-01: 5 mg via ORAL
  Filled 2020-12-01: qty 1

## 2020-12-01 MED ORDER — SEVOFLURANE IN SOLN
RESPIRATORY_TRACT | Status: AC
  Filled 2020-12-01: qty 250

## 2020-12-01 MED ORDER — ROCURONIUM BROMIDE 10 MG/ML (PF) SYRINGE
PREFILLED_SYRINGE | INTRAVENOUS | Status: AC
  Filled 2020-12-01: qty 10

## 2020-12-01 MED ORDER — BUPIVACAINE HCL (PF) 0.5 % IJ SOLN
INTRAMUSCULAR | Status: AC
  Filled 2020-12-01: qty 30

## 2020-12-01 MED ORDER — ORAL CARE MOUTH RINSE: 15.0000 mL | Freq: Once | OROMUCOSAL | Status: DC

## 2020-12-01 MED ORDER — ROCURONIUM BROMIDE 100 MG/10ML IV SOLN
INTRAVENOUS | Status: DC | PRN
  Administered 2020-12-01: 50 mg via INTRAVENOUS
  Administered 2020-12-01: 30 mg via INTRAVENOUS
  Administered 2020-12-01 (×2): 20 mg via INTRAVENOUS
  Administered 2020-12-01: 50 mg via INTRAVENOUS

## 2020-12-01 MED ORDER — POVIDONE-IODINE 10 % EX SWAB: 2.0000 "application " | Freq: Once | CUTANEOUS | Status: DC

## 2020-12-01 MED ORDER — OXYCODONE HCL 5 MG PO TABS
5.0000 mg | ORAL_TABLET | Freq: Once | ORAL | Status: AC | PRN
Start: 2020-12-01 — End: 2020-12-01

## 2020-12-01 MED ORDER — CHLORHEXIDINE GLUCONATE 0.12 % MT SOLN: 15.0000 mL | Freq: Once | OROMUCOSAL | Status: DC

## 2020-12-01 MED ORDER — DEXAMETHASONE SODIUM PHOSPHATE 10 MG/ML IJ SOLN
INTRAMUSCULAR | Status: AC
  Filled 2020-12-01: qty 1

## 2020-12-01 MED ORDER — ONDANSETRON HCL 4 MG/2ML IJ SOLN
INTRAMUSCULAR | Status: DC | PRN
  Administered 2020-12-01: 4 mg via INTRAVENOUS

## 2020-12-01 MED ORDER — PHENYLEPHRINE HCL (PRESSORS) 10 MG/ML IV SOLN
INTRAVENOUS | Status: DC | PRN
  Administered 2020-12-01: 100 ug via INTRAVENOUS
  Administered 2020-12-01 (×2): 200 ug via INTRAVENOUS
  Administered 2020-12-01 (×4): 100 ug via INTRAVENOUS

## 2020-12-01 MED ORDER — SUGAMMADEX SODIUM 500 MG/5ML IV SOLN
INTRAVENOUS | Status: AC
  Filled 2020-12-01: qty 5

## 2020-12-01 MED ORDER — GABAPENTIN 300 MG PO CAPS: 300.0000 mg | ORAL_CAPSULE | ORAL | Status: AC

## 2020-12-01 MED ORDER — DEXMEDETOMIDINE (PRECEDEX) IN NS 20 MCG/5ML (4 MCG/ML) IV SYRINGE
PREFILLED_SYRINGE | INTRAVENOUS | Status: DC | PRN
  Administered 2020-12-01 (×2): 8 ug via INTRAVENOUS
  Administered 2020-12-01: 4 ug via INTRAVENOUS

## 2020-12-01 MED ORDER — PROPOFOL 10 MG/ML IV BOLUS
INTRAVENOUS | Status: DC | PRN
  Administered 2020-12-01: 200 mg via INTRAVENOUS

## 2020-12-01 MED ORDER — MIDAZOLAM HCL 2 MG/2ML IJ SOLN
INTRAMUSCULAR | Status: AC
  Filled 2020-12-01: qty 2

## 2020-12-01 MED ORDER — ACETAMINOPHEN 500 MG PO TABS
ORAL_TABLET | ORAL | Status: AC
  Administered 2020-12-01: 1000 mg via ORAL
  Filled 2020-12-01: qty 2

## 2020-12-01 MED ORDER — IBUPROFEN 800 MG PO TABS
800.0000 mg | ORAL_TABLET | Freq: Three times a day (TID) | ORAL | 1 refills | Status: DC
  Filled 2020-12-01: qty 30, 10d supply, fill #0
  Filled 2021-07-09: qty 30, 10d supply, fill #1

## 2020-12-01 MED ORDER — FENTANYL CITRATE (PF) 100 MCG/2ML IJ SOLN
INTRAMUSCULAR | Status: DC | PRN
  Administered 2020-12-01 (×2): 50 ug via INTRAVENOUS

## 2020-12-01 MED ORDER — GABAPENTIN 300 MG PO CAPS
ORAL_CAPSULE | ORAL | Status: AC
  Administered 2020-12-01: 300 mg via ORAL
  Filled 2020-12-01: qty 1

## 2020-12-01 MED ORDER — LIDOCAINE-EPINEPHRINE 1 %-1:100000 IJ SOLN
INTRAMUSCULAR | Status: DC | PRN
  Administered 2020-12-01: 4 mL

## 2020-12-01 MED ORDER — SUGAMMADEX SODIUM 500 MG/5ML IV SOLN
INTRAVENOUS | Status: DC | PRN
  Administered 2020-12-01: 250 mg via INTRAVENOUS

## 2020-12-01 MED ORDER — LIDOCAINE HCL (PF) 2 % IJ SOLN
INTRAMUSCULAR | Status: AC
  Filled 2020-12-01: qty 5

## 2020-12-01 MED ORDER — CEFAZOLIN SODIUM-DEXTROSE 2-4 GM/100ML-% IV SOLN
INTRAVENOUS | Status: AC
  Filled 2020-12-01: qty 100

## 2020-12-01 MED ORDER — PROPOFOL 10 MG/ML IV BOLUS
INTRAVENOUS | Status: AC
  Filled 2020-12-01: qty 20

## 2020-12-01 MED ORDER — OXYCODONE HCL 5 MG/5ML PO SOLN: 5.0000 mg | Freq: Once | ORAL | Status: AC | PRN

## 2020-12-01 MED ORDER — DEXMEDETOMIDINE (PRECEDEX) IN NS 20 MCG/5ML (4 MCG/ML) IV SYRINGE
PREFILLED_SYRINGE | INTRAVENOUS | Status: AC
  Filled 2020-12-01: qty 5

## 2020-12-01 MED ORDER — OXYCODONE HCL 5 MG PO TABS
5.0000 mg | ORAL_TABLET | ORAL | 0 refills | Status: AC | PRN
  Filled 2020-12-01: qty 20, 4d supply, fill #0

## 2020-12-01 MED ORDER — ACETAMINOPHEN 500 MG PO TABS: 1000.0000 mg | ORAL_TABLET | ORAL | Status: AC

## 2020-12-01 MED ORDER — ACETAMINOPHEN 500 MG PO TABS
1000.0000 mg | ORAL_TABLET | Freq: Four times a day (QID) | ORAL | 0 refills | Status: AC
  Filled 2020-12-01: qty 24, 3d supply, fill #0

## 2020-12-01 MED ORDER — LIDOCAINE-EPINEPHRINE 1 %-1:100000 IJ SOLN
INTRAMUSCULAR | Status: AC
  Filled 2020-12-01: qty 1

## 2020-12-01 MED ORDER — LACTATED RINGERS IV SOLN: INTRAVENOUS | Status: DC

## 2020-12-01 MED ORDER — FENTANYL CITRATE (PF) 100 MCG/2ML IJ SOLN
25.0000 ug | INTRAMUSCULAR | Status: DC | PRN
  Administered 2020-12-01: 25 ug via INTRAVENOUS
  Administered 2020-12-01: 50 ug via INTRAVENOUS

## 2020-12-01 MED ORDER — BUPIVACAINE HCL (PF) 0.5 % IJ SOLN
INTRAMUSCULAR | Status: DC | PRN
  Administered 2020-12-01: 13 mL

## 2020-12-01 MED ORDER — LIDOCAINE HCL (CARDIAC) PF 100 MG/5ML IV SOSY
PREFILLED_SYRINGE | INTRAVENOUS | Status: DC | PRN
  Administered 2020-12-01: 100 mg via INTRAVENOUS

## 2020-12-01 SURGICAL SUPPLY — 94 items
ADH SKN CLS APL DERMABOND .7 (GAUZE/BANDAGES/DRESSINGS) ×4
APL PRP STRL LF DISP 70% ISPRP (MISCELLANEOUS) ×2
APL SRG 38 LTWT LNG FL B (MISCELLANEOUS) ×2
APPLICATOR ARISTA FLEXITIP XL (MISCELLANEOUS) ×3 IMPLANT
BAG DRN RND TRDRP ANRFLXCHMBR (UROLOGICAL SUPPLIES) ×2
BAG URINE DRAIN 2000ML AR STRL (UROLOGICAL SUPPLIES) ×3 IMPLANT
BLADE SURG 15 STRL LF DISP TIS (BLADE) ×2 IMPLANT
BLADE SURG 15 STRL SS (BLADE) ×3
BLADE SURG SZ11 CARB STEEL (BLADE) ×3 IMPLANT
CANISTER SUCT 1200ML W/VALVE (MISCELLANEOUS) ×3 IMPLANT
CATH FOLEY 2WAY  5CC 16FR (CATHETERS) ×1
CATH FOLEY 2WAY 5CC 16FR (CATHETERS) ×2
CATH URTH 16FR FL 2W BLN LF (CATHETERS) ×2 IMPLANT
CHLORAPREP W/TINT 26 (MISCELLANEOUS) ×3 IMPLANT
COVER TIP SHEARS 8 DVNC (MISCELLANEOUS) ×2 IMPLANT
COVER TIP SHEARS 8MM DA VINCI (MISCELLANEOUS) ×1
COVER WAND RF STERILE (DRAPES) ×3 IMPLANT
DEFOGGER SCOPE WARMER CLEARIFY (MISCELLANEOUS) ×3 IMPLANT
DERMABOND ADVANCED (GAUZE/BANDAGES/DRESSINGS) ×2
DERMABOND ADVANCED .7 DNX12 (GAUZE/BANDAGES/DRESSINGS) ×4 IMPLANT
DRAPE 3/4 80X56 (DRAPES) ×6 IMPLANT
DRAPE ARM DVNC X/XI (DISPOSABLE) ×8 IMPLANT
DRAPE COLUMN DVNC XI (DISPOSABLE) ×2 IMPLANT
DRAPE DA VINCI XI ARM (DISPOSABLE) ×4
DRAPE DA VINCI XI COLUMN (DISPOSABLE) ×1
DRAPE GENERAL ENDO 106X123.5 (DRAPES) ×3 IMPLANT
DRAPE UNDER BUTTOCK W/FLU (DRAPES) ×3 IMPLANT
ELECT REM PT RETURN 9FT ADLT (ELECTROSURGICAL) ×3
ELECTRODE REM PT RTRN 9FT ADLT (ELECTROSURGICAL) ×2 IMPLANT
FILTER LAP SMOKE EVAC STRL (MISCELLANEOUS) ×3 IMPLANT
GLOVE SURG ENC MOIS LTX SZ7 (GLOVE) ×12 IMPLANT
GLOVE SURG UNDER LTX SZ7.5 (GLOVE) ×12 IMPLANT
GOWN STRL REUS W/ TWL LRG LVL3 (GOWN DISPOSABLE) ×16 IMPLANT
GOWN STRL REUS W/TWL LRG LVL3 (GOWN DISPOSABLE) ×24
GRASPER SUT TROCAR 14GX15 (MISCELLANEOUS) ×3 IMPLANT
HEMOSTAT ARISTA ABSORB 3G PWDR (HEMOSTASIS) ×3 IMPLANT
IRRIGATION STRYKERFLOW (MISCELLANEOUS) ×2 IMPLANT
IRRIGATOR STRYKERFLOW (MISCELLANEOUS) ×3
IRRIGATOR SUCT 8 DISP DVNC XI (IRRIGATION / IRRIGATOR) ×2 IMPLANT
IRRIGATOR SUCTION 8MM XI DISP (IRRIGATION / IRRIGATOR) ×1
IV NS 1000ML (IV SOLUTION) ×3
IV NS 1000ML BAXH (IV SOLUTION) ×2 IMPLANT
KIT PINK PAD W/HEAD ARE REST (MISCELLANEOUS) ×3
KIT PINK PAD W/HEAD ARM REST (MISCELLANEOUS) ×2 IMPLANT
KIT TURNOVER CYSTO (KITS) ×3 IMPLANT
LABEL OR SOLS (LABEL) ×3 IMPLANT
MANIFOLD NEPTUNE II (INSTRUMENTS) ×3 IMPLANT
MANIPULATOR VCARE LG CRV RETR (MISCELLANEOUS) IMPLANT
MANIPULATOR VCARE SML CRV RETR (MISCELLANEOUS) IMPLANT
MANIPULATOR VCARE STD CRV RETR (MISCELLANEOUS) ×3 IMPLANT
NEEDLE FILTER BLUNT 18X 1/2SAF (NEEDLE) ×1
NEEDLE FILTER BLUNT 18X1 1/2 (NEEDLE) ×2 IMPLANT
NEEDLE HYPO 22GX1.5 SAFETY (NEEDLE) ×3 IMPLANT
NS IRRIG 1000ML POUR BTL (IV SOLUTION) ×3 IMPLANT
NS IRRIG 500ML POUR BTL (IV SOLUTION) ×3 IMPLANT
OBTURATOR OPTICAL STANDARD 8MM (TROCAR) ×1
OBTURATOR OPTICAL STND 8 DVNC (TROCAR) ×2
OBTURATOR OPTICALSTD 8 DVNC (TROCAR) ×2 IMPLANT
OCCLUDER COLPOPNEUMO (BALLOONS) ×3 IMPLANT
PACK BASIN MINOR ARMC (MISCELLANEOUS) ×3 IMPLANT
PACK GYN LAPAROSCOPIC (MISCELLANEOUS) ×3 IMPLANT
PAD OB MATERNITY 4.3X12.25 (PERSONAL CARE ITEMS) ×3 IMPLANT
PAD PREP 24X41 OB/GYN DISP (PERSONAL CARE ITEMS) ×3 IMPLANT
PORT ACCESS TROCAR AIRSEAL 5 (TROCAR) ×3 IMPLANT
SCISSORS METZENBAUM CVD 33 (INSTRUMENTS) IMPLANT
SEAL CANN UNIV 5-8 DVNC XI (MISCELLANEOUS) ×8 IMPLANT
SEAL XI 5MM-8MM UNIVERSAL (MISCELLANEOUS) ×4
SEALER VESSEL DA VINCI XI (MISCELLANEOUS) ×1
SEALER VESSEL EXT DVNC XI (MISCELLANEOUS) ×2 IMPLANT
SET CYSTO W/LG BORE CLAMP LF (SET/KITS/TRAYS/PACK) ×3 IMPLANT
SET TRI-LUMEN FLTR TB AIRSEAL (TUBING) ×3 IMPLANT
SOL PREP PVP 2OZ (MISCELLANEOUS) ×3
SOLUTION ELECTROLUBE (MISCELLANEOUS) ×3 IMPLANT
SOLUTION PREP PVP 2OZ (MISCELLANEOUS) ×2 IMPLANT
SPONGE LAP 4X18 RFD (DISPOSABLE) ×3 IMPLANT
STRIP CLOSURE SKIN 1/4X4 (GAUZE/BANDAGES/DRESSINGS) ×3 IMPLANT
SURGILUBE 2OZ TUBE FLIPTOP (MISCELLANEOUS) ×3 IMPLANT
SUT CHROMIC 3 0 SH 27 (SUTURE) ×3 IMPLANT
SUT MNCRL 4-0 (SUTURE) ×3
SUT MNCRL 4-0 27XMFL (SUTURE) ×2
SUT MNCRL AB 4-0 PS2 18 (SUTURE) ×3 IMPLANT
SUT VIC AB 0 CT2 27 (SUTURE) ×3 IMPLANT
SUT VIC AB 2-0 SH 27 (SUTURE) ×9
SUT VIC AB 2-0 SH 27XBRD (SUTURE) ×6 IMPLANT
SUT VIC AB 2-0 UR6 27 (SUTURE) ×3 IMPLANT
SUT VIC AB 4-0 SH 27 (SUTURE) ×3
SUT VIC AB 4-0 SH 27XANBCTRL (SUTURE) ×2 IMPLANT
SUT VLOC 90 2/L VL 12 GS22 (SUTURE) ×3 IMPLANT
SUTURE MNCRL 4-0 27XMF (SUTURE) ×2 IMPLANT
SYR 10ML LL (SYRINGE) ×3 IMPLANT
SYR 5ML LL (SYRINGE) ×3 IMPLANT
TOWEL OR 17X26 4PK STRL BLUE (TOWEL DISPOSABLE) ×3 IMPLANT
TROCAR XCEL NON-BLD 5MMX100MML (ENDOMECHANICALS) ×3 IMPLANT
TUBING CONNECTING 10 (TUBING) ×6 IMPLANT

## 2020-12-01 NOTE — Transfer of Care (Signed)
Immediate Anesthesia Transfer of Care Note  Patient: Susan Kramer  Procedure(s) Performed: XI ROBOTIC ASSISTED TOTAL HYSTERECTOMY WITH BILATERAL SALPINGECTOMY (Bilateral ) LAPAROSCOPIC OVARIAN CYSTECTOMY (Bilateral ) VULVAR MOLE EXCISION (N/A ) CYSTOSCOPY (N/A )  Patient Location: PACU  Anesthesia Type:General  Level of Consciousness: drowsy  Airway & Oxygen Therapy: Patient connected to face mask oxygen  Post-op Assessment: Post -op Vital signs reviewed and stable  Post vital signs: stable  Last Vitals:  Vitals Value Taken Time  BP 152/79 12/01/20 1245  Temp    Pulse 98 12/01/20 1247  Resp 22 12/01/20 1247  SpO2 100 % 12/01/20 1247  Vitals shown include unvalidated device data.  Last Pain:  Vitals:   12/01/20 0752  TempSrc: Temporal  PainSc: 0-No pain      Patients Stated Pain Goal: 0 (39/68/86 4847)  Complications: No complications documented.

## 2020-12-01 NOTE — Discharge Instructions (Addendum)
Discharge instructions after  robotically-assisted total laparoscopic hysterectomy   For the next three days, take ibuprofen and acetaminophen on a schedule, every 8 hours. You can take them together or you can intersperse them, and take one every four hours. I also gave you gabapentin for nighttime, to help you sleep and also to control pain. Take gabapentin medicines at night for at least the next 3 nights. You also have a narcotic, oxycodone, to take as needed if the above medicines don't help.  Postop constipation is a major cause of pain. Stay well hydrated, walk as you tolerate, and take over the counter senna as well as stool softeners if you need them.   Signs and Symptoms to Report Call our office at [phone] if you have any of the following.  . Fever over 100.4 degrees or higher . Severe stomach pain not relieved with pain medications . Bright red bleeding that's heavier than a period that does not slow with rest . To go the bathroom a lot (frequency), you can't hold your urine (urgency), or it hurts when you empty your bladder (urinate) . Chest pain . Shortness of breath . Pain in the calves of your legs . Severe nausea and vomiting not relieved with anti-nausea medications . Signs of infection around your wounds, such as redness, hot to touch, swelling, green/yellow drainage (like pus), bad smelling discharge . Any concerns  What You Can Expect after Surgery . You may see some pink tinged, bloody fluid and bruising around the wound. This is normal. . You may notice shoulder and neck pain. This is caused by the gas used during surgery to expand your abdomen so your surgeon could get to the uterus easier. . You may have a sore throat because of the tube in your mouth during general anesthesia. This will go away in 2 to 3 days. . You may have some stomach cramps. . You may notice spotting on your panties. . You may have pain around the incision sites.   Activities after  Your Discharge Follow these guidelines to help speed your recovery at home: . Do the coughing and deep breathing as you did in the hospital for 2 weeks. Use the small blue breathing device, called the incentive spirometer for 2 weeks. . Don't drive if you are in pain or taking narcotic pain medicine. You may drive when you can safely slam on the brakes, turn the wheel forcefully, and rotate your torso comfortably. This is typically 1-2 weeks. Practice in a parking lot or side street prior to attempting to drive regularly.  . Ask others to help with household chores for 4 weeks. . Do not lift anything heavier that 10 pounds for 4-6 weeks. This includes pets, children, and groceries. . Don't do strenuous activities, exercises, or sports like vacuuming, tennis, squash, etc. until your doctor says it is safe to do so. ---Maintain pelvic rest for 12 weeks. This means nothing in the vagina or rectum at all (no douching, tampons, intercourse) for 12 weeks.  . Walk as you feel able. Rest often since it may take two or three weeks for your energy level to return to normal.  . You may climb stairs . Avoid constipation:   -Eat fruits, vegetables, and whole grains. Eat small meals as your appetite will take time to return to normal.   -Drink 6 to 8 glasses of water each day unless your doctor has told you to limit your fluids.   -Use a laxative or   stool softener as needed if constipation becomes a problem. You may take Miralax, metamucil, Citrucil, Colace, Senekot, FiberCon, etc. If this does not relieve the constipation, try two tablespoons of Milk Of Magnesia every 8 hours until your bowels move.  . You may shower. Gently wash the wounds with a mild soap and water. Pat dry. . Do not get in a hot tub, swimming pool, etc. for 6 weeks. . Do not use lotions, oils, powders on the wounds. . Do not douche, use tampons, or have sex until your doctor says it is okay. . Take your pain medicine when you need it. The  medicine may not work as well if the pain is bad.  Take the medicines you were taking before surgery. Other medications you will need are pain medications (Norco or Percocet) and nausea medications (Zofran).   AMBULATORY SURGERY  DISCHARGE INSTRUCTIONS   1) The drugs that you were given will stay in your system until tomorrow so for the next 24 hours you should not:  A) Drive an automobile B) Make any legal decisions C) Drink any alcoholic beverage   2) You may resume regular meals tomorrow.  Today it is better to start with liquids and gradually work up to solid foods.  You may eat anything you prefer, but it is better to start with liquids, then soup and crackers, and gradually work up to solid foods.   3) Please notify your doctor immediately if you have any unusual bleeding, trouble breathing, redness and pain at the surgery site, drainage, fever, or pain not relieved by medication.    4) Additional Instructions:        Please contact your physician with any problems or Same Day Surgery at [phone], Monday through Friday 6 am to 4 pm, or [place] [facility] [place] [facility] number at [phone].   

## 2020-12-01 NOTE — Anesthesia Preprocedure Evaluation (Signed)
Anesthesia Evaluation  Patient identified by MRN, date of birth, ID band Patient awake    Reviewed: Allergy & Precautions, H&P , NPO status , Patient's Chart, lab work & pertinent test results  History of Anesthesia Complications Negative for: history of anesthetic complications  Airway Mallampati: II  TM Distance: >3 FB Neck ROM: full    Dental  (+) Chipped   Pulmonary neg pulmonary ROS, neg shortness of breath,    Pulmonary exam normal        Cardiovascular Exercise Tolerance: Good hypertension, (-) angina(-) Past MI and (-) DOE Normal cardiovascular exam     Neuro/Psych negative neurological ROS  negative psych ROS   GI/Hepatic Neg liver ROS, GERD  Medicated and Controlled,  Endo/Other  diabetes, Type 2  Renal/GU      Musculoskeletal   Abdominal   Peds  Hematology negative hematology ROS (+)   Anesthesia Other Findings Past Medical History: No date: Diabetes ([place]) No date: GERD (gastroesophageal reflux disease) No date: High blood pressure No date: High cholesterol  Past Surgical History: No date: BREAST REDUCTION SURGERY     Comment:  12 years ago 2009: CHOLECYSTECTOMY No date: REDUCTION MAMMAPLASTY; Bilateral  BMI    Body Mass Index: 36.01 kg/m      Reproductive/Obstetrics negative OB ROS                             Anesthesia Physical Anesthesia Plan  ASA: III  Anesthesia Plan: General ETT   Post-op Pain Management:    Induction: Intravenous  PONV Risk Score and Plan: Ondansetron, Dexamethasone, Midazolam and Treatment may vary due to age or medical condition  Airway Management Planned: Oral ETT  Additional Equipment:   Intra-op Plan:   Post-operative Plan: Extubation in OR  Informed Consent: I have reviewed the patients History and Physical, chart, labs and discussed the procedure including the risks, benefits and alternatives for the proposed anesthesia  with the patient or authorized representative who has indicated his/her understanding and acceptance.     Dental Advisory Given  Plan Discussed with: Anesthesiologist, CRNA and Surgeon  Anesthesia Plan Comments: (Patient consented for risks of anesthesia including but not limited to:  - adverse reactions to medications - damage to eyes, teeth, lips or other oral mucosa - nerve damage due to positioning  - sore throat or hoarseness - Damage to heart, brain, nerves, lungs, other parts of body or loss of life  Patient voiced understanding.)        Anesthesia Quick Evaluation

## 2020-12-01 NOTE — Anesthesia Procedure Notes (Signed)
Procedure Name: Intubation Date/Time: 12/01/2020 8:36 AM Performed by: Aline Brochure, CRNA Pre-anesthesia Checklist: Patient identified, Emergency Drugs available, Suction available and Patient being monitored Patient Re-evaluated:Patient Re-evaluated prior to induction Oxygen Delivery Method: Circle system utilized Preoxygenation: Pre-oxygenation with 100% oxygen Induction Type: IV induction Ventilation: Oral airway inserted - appropriate to patient size and Mask ventilation with difficulty Laryngoscope Size: McGraph and 3 Grade View: Grade I Tube type: Oral Tube size: 7.0 mm Number of attempts: 1 Airway Equipment and Method: Stylet and Video-laryngoscopy Placement Confirmation: ETT inserted through vocal cords under direct vision,  positive ETCO2 and breath sounds checked- equal and bilateral Secured at: 21 cm Tube secured with: Tape Dental Injury: Teeth and Oropharynx as per pre-operative assessment  Difficulty Due To: Difficulty was anticipated, Difficult Airway- due to large tongue and Difficult Airway- due to limited oral opening

## 2020-12-01 NOTE — Anesthesia Postprocedure Evaluation (Signed)
Anesthesia Post Note  Patient: Susan Kramer  Procedure(s) Performed: XI ROBOTIC ASSISTED TOTAL HYSTERECTOMY WITH BILATERAL SALPINGECTOMY (Bilateral ) LAPAROSCOPIC OVARIAN CYSTECTOMY (Bilateral ) VULVAR MOLE EXCISION (N/A ) CYSTOSCOPY (N/A )  Patient location during evaluation: PACU Anesthesia Type: General Level of consciousness: awake and alert Pain management: pain level controlled Vital Signs Assessment: post-procedure vital signs reviewed and stable Respiratory status: spontaneous breathing, nonlabored ventilation, respiratory function stable and patient connected to nasal cannula oxygen Cardiovascular status: blood pressure returned to baseline and stable Postop Assessment: no apparent nausea or vomiting Anesthetic complications: no   No complications documented.   Last Vitals:  Vitals:   12/01/20 1431 12/01/20 1443  BP: 137/68 (!) 141/75  Pulse: 98 93  Resp: 16 18  Temp:  36.7 C  SpO2: 93% 95%    Last Pain:  Vitals:   12/01/20 1443  TempSrc: Temporal  PainSc: 5                  Martha Clan

## 2020-12-01 NOTE — Interval H&P Note (Signed)
History and Physical Interval Note:  12/01/2020 7:21 AM  Susan Kramer  has presented today for surgery, with the diagnosis of chronic pelvic pain, pressure.  The various methods of treatment have been discussed with the patient and family. After consideration of risks, benefits and other options for treatment, the patient has consented to  Procedure(s): XI ROBOTIC ASSISTED TOTAL HYSTERECTOMY WITH BILATERAL SALPINGECTOMY (Bilateral) LAPAROSCOPIC OVARIAN CYSTECTOMY (Bilateral) VULVAR MOLE EXCISION (N/A) as a surgical intervention.  The patient's history has been reviewed, patient examined, no change in status, stable for surgery.  I have reviewed the patient's chart and labs.  Questions were answered to the patient's satisfaction.     Benjaman Kindler

## 2020-12-01 NOTE — Op Note (Addendum)
Susan Kramer PROCEDURE DATE: 12/01/2020  PREOPERATIVE DIAGNOSIS: Pelvic pain, pressure and hx of endometriosis POSTOPERATIVE DIAGNOSIS: The same PROCEDURE:  XI ROBOTIC ASSISTED TOTAL HYSTERECTOMY WITH BILATERAL SALPINGECTOMY:   VULVAR MOLE EXCISION:  CYSTOSCOPY:   SURGEON:  Dr. Benjaman Kindler, MD ASSISTANT: CST Anesthesiologist:  Anesthesiologist: Martha Clan, MD CRNA: Aline Brochure, CRNA; Tollie Eth, CRNA; Johnna Acosta, CRNA  INDICATIONS: 50 y.o. G2P1011  here for definitive surgical management secondary to the indications listed under preoperative diagnoses; please see preoperative note for further details.  Risks of surgery were discussed with the patient including but not limited to: bleeding which may require transfusion or reoperation; infection which may require antibiotics; injury to bowel, bladder, ureters or other surrounding organs; need for additional procedures; thromboembolic phenomenon, incisional problems and other postoperative/anesthesia complications. Written informed consent was obtained.    FINDINGS:   Pelvic: Three large multi-mm normal moles noted on the vulva and groin fold.  Vagina negative. Adnexa negative for masses or nodularity. Cervix without gross lesions. Uterus mobile, anteverted, small.  Intraoperative findings revealed a normal upper abdomen including bowel, diaphragmatic surfaces, stomach, and omentum.  The uterus was small and mobile.  The right and left ovaries appeared normal. No visible cysts were noted. No obvious endometriosis implants  Bilateral tubes appeared normal.  This case was made difficult by redundant bowel.   ANESTHESIA:    General INTRAVENOUS FLUIDS:1500  ml ESTIMATED BLOOD LOSS:30 ml URINE OUTPUT: 800 ml  SPECIMENS: Uterus, cervix, bilateral fallopian tubes and vulvar moles. COMPLICATIONS: None immediate  PROCEDURE IN DETAIL: After informed consent was obtained, the patient was taken to the operating  room where general anesthesia was obtained without difficulty. The patient was positioned in the dorsal lithotomy position in [place] and her arms were carefully tucked at her sides and the usual precautions were taken. Deep Trendelenburg (20-25 deg) was established to confirm that she does not shift on the table.  The moles on her vulvar skin were prepped and removed in an elliptical fashion, closed with 4-0 monocryl and covered with Dermabond. 3 separate sites were removed. No evidence of malignancy  She was prepped and draped in normal sterile fashion.  Time-out was performed and a Foley catheter was placed into the bladder. A standard VCare uterine manipulator was then placed in the uterus without incident.  Preoperative prophylactic antibiotics were given through her iv.  After infiltration of local anesthetic at the proposed trocar sites, an 8 mm incision was created at the umbilicus and a 53mm trocar was placed under direct visualization. Pneumoperitoneum was created to a pressure of 15 mm Hg. The camera was placed and the abdomin surveyed, noting intact bowel below the site of entry. A survey of the pelvis and upper abdomen revealed the above findings. Right and left lateral 8-mm robotic ports were placed under direct visualization. A third robot port was placed on the right.  The patient was placed in deepTrendelenburg and the bowel was displaced up into the upper abdomen. The robot was left side docked. The instruments were placed under direct visualization.   The ureters were identified bilaterally coursing outside of the operative field. Round ligaments were divided on each side with the EndoShears and the retroperitoneal space was opened bilaterally. The posterior leaflet of the broad was taken down to the level of the IP ligament. The anterior leaflet of the broad ligament was carefully taken down to the midline.  A bladder flap was created and the bladder was dissected down off the  lower uterine segment and cervix using endoshears and electrocautery.   The Fallopian tubes were divided from the ovaries, and care taken to hemostatically transect the utero-ovarian ligament. The peritoneum was taken down to the level of the internal os, and the uterine arteries skeletonized. With strong cephalad pressure from the V-care, bipolar cautery was used to seal and transect the uterine arteries, and the pedicles allowed to fall away laterally.  A colpotomy was performed circumferentially along the V-Care ring with monopolar electrocautery and the cervix was incised from the vagina using the laparoscopic scissors. This was difficult due to the redundant bowel.  The specimen was removed through the vagina.  The specimen was placed in the vagina and the vaginal cuff was then closed in a running continuous fashion using the  0 V-Lock suture with careful attention to include the vaginal cuff angles and the vaginal mucosa within the closure, contending with the bowel throughout.  Hemostasis was secured with suction-irrigation.The intraperitoneal pressure was dropped, and all planes of dissection, vascular pedicles and the vaginal cuff were found to be mostly hemostatic. Arista was placed.   The robot was undocked. The lateral trocars were removed under visualization.  The CO2 gas was released and several deep breaths given to remove any remaining CO2 from the peritoneal cavity.  The skin incisions were closed with 4-0 Monocryl subcuticular stitch and surgical glue placed.   Cystoscopy revealed intact bladder mucosa, with spill active from both ostia.  The ovaries were evaluated during the case, and found to be entirely normal. No cysts were noted to remove, and they were left alone.   Anesthesia was reversed without difficulty.  The patient tolerated the procedure well.  Sponge, lap and needle counts were correct x2.  The patient was taken to recovery room in excellent condition.

## 2020-12-02 ENCOUNTER — Encounter: Payer: Self-pay | Admitting: Obstetrics and Gynecology

## 2020-12-02 LAB — CA 125: Cancer Antigen (CA) 125: 13.3 U/mL (ref 0.0–38.1)

## 2020-12-05 LAB — SURGICAL PATHOLOGY

## 2020-12-07 ENCOUNTER — Other Ambulatory Visit: Payer: Self-pay

## 2020-12-11 MED FILL — Metformin HCl Tab 1000 MG: ORAL | 90 days supply | Qty: 180 | Fill #0 | Status: AC

## 2020-12-12 ENCOUNTER — Other Ambulatory Visit: Payer: Self-pay

## 2020-12-19 ENCOUNTER — Other Ambulatory Visit: Payer: Self-pay

## 2020-12-19 DIAGNOSIS — R300 Dysuria: Secondary | ICD-10-CM | POA: Diagnosis not present

## 2020-12-19 DIAGNOSIS — N898 Other specified noninflammatory disorders of vagina: Secondary | ICD-10-CM | POA: Diagnosis not present

## 2020-12-19 DIAGNOSIS — R3989 Other symptoms and signs involving the genitourinary system: Secondary | ICD-10-CM | POA: Diagnosis not present

## 2020-12-19 MED ORDER — FLUCONAZOLE 150 MG PO TABS
ORAL_TABLET | ORAL | 0 refills | Status: DC
  Filled 2020-12-19: qty 2, 7d supply, fill #0

## 2020-12-20 ENCOUNTER — Other Ambulatory Visit: Payer: Self-pay

## 2020-12-21 ENCOUNTER — Other Ambulatory Visit: Payer: Self-pay

## 2020-12-21 MED FILL — Telmisartan Tab 40 MG: ORAL | 30 days supply | Qty: 30 | Fill #0 | Status: AC

## 2020-12-22 ENCOUNTER — Other Ambulatory Visit: Payer: Self-pay

## 2021-01-02 ENCOUNTER — Other Ambulatory Visit: Payer: Self-pay

## 2021-01-02 DIAGNOSIS — B379 Candidiasis, unspecified: Secondary | ICD-10-CM | POA: Diagnosis not present

## 2021-01-02 DIAGNOSIS — T8189XD Other complications of procedures, not elsewhere classified, subsequent encounter: Secondary | ICD-10-CM | POA: Diagnosis not present

## 2021-01-02 DIAGNOSIS — Z9889 Other specified postprocedural states: Secondary | ICD-10-CM | POA: Diagnosis not present

## 2021-01-02 MED ORDER — CEPHALEXIN 500 MG PO CAPS
500.0000 mg | ORAL_CAPSULE | Freq: Two times a day (BID) | ORAL | 0 refills | Status: AC
  Filled 2021-01-02: qty 14, 7d supply, fill #0

## 2021-01-02 MED ORDER — FLUCONAZOLE 150 MG PO TABS
ORAL_TABLET | ORAL | 0 refills | Status: AC
  Filled 2021-01-02: qty 2, 7d supply, fill #0

## 2021-01-06 MED FILL — Atorvastatin Calcium Tab 40 MG (Base Equivalent): ORAL | 90 days supply | Qty: 90 | Fill #0 | Status: AC

## 2021-01-06 MED FILL — Dicyclomine HCl Cap 10 MG: ORAL | 90 days supply | Qty: 270 | Fill #0 | Status: AC

## 2021-01-08 ENCOUNTER — Other Ambulatory Visit: Payer: Self-pay

## 2021-01-17 ENCOUNTER — Ambulatory Visit
Admission: EM | Admit: 2021-01-17 | Discharge: 2021-01-17 | Disposition: A | Discharge status: Home / Self Care | Payer: 59 | Attending: Emergency Medicine | Admitting: Emergency Medicine

## 2021-01-17 ENCOUNTER — Other Ambulatory Visit: Payer: Self-pay

## 2021-01-17 DIAGNOSIS — Z1152 Encounter for screening for COVID-19: Secondary | ICD-10-CM

## 2021-01-17 DIAGNOSIS — J209 Acute bronchitis, unspecified: Secondary | ICD-10-CM

## 2021-01-17 DIAGNOSIS — I10 Essential (primary) hypertension: Secondary | ICD-10-CM | POA: Diagnosis not present

## 2021-01-17 MED ORDER — ALBUTEROL SULFATE HFA 108 (90 BASE) MCG/ACT IN AERS
1.0000 | INHALATION_SPRAY | Freq: Four times a day (QID) | RESPIRATORY_TRACT | 0 refills | Status: AC | PRN
  Filled 2021-01-17: qty 18, 30d supply, fill #0

## 2021-01-17 MED ORDER — PREDNISONE 10 MG PO TABS
ORAL_TABLET | Freq: Every day | ORAL | 0 refills | Status: AC
  Filled 2021-01-17: qty 21, 6d supply, fill #0

## 2021-01-17 MED ORDER — BENZONATATE 100 MG PO CAPS
100.0000 mg | ORAL_CAPSULE | Freq: Three times a day (TID) | ORAL | 0 refills | Status: AC | PRN
  Filled 2021-01-17: qty 21, 7d supply, fill #0

## 2021-01-17 NOTE — Discharge Instructions (Signed)
Use the albuterol inhaler and take the prednisone as directed.  Monitor your blood sugar frequently while taking the prednisone as it can cause elevated blood sugar.    Take the [facility] as needed for cough.    Your COVID and influenza tests are pending.  You should self quarantine until the test results are back.  Please report your symptoms to employee health.    Go to the emergency department if you have acute shortness of breath or other concerning symptoms.    Follow up with your primary care provider if your symptoms are not improving.

## 2021-01-17 NOTE — ED Triage Notes (Signed)
Pt reports having wet cough and nasal drainage x2 days. Denies having fever or any other symptoms.  2 neg covid test in the last 3 days.

## 2021-01-17 NOTE — ED Provider Notes (Signed)
Roderic Palau    CSN: [id number] Arrival date & time: 01/17/21  1550      History   Chief Complaint Chief Complaint  Patient presents with  . Cough    HPI Susan Kramer is a 50 y.o. female.   Patient presents with 2-day history of sinus congestion and cough.  She denies fever, rash, shortness of breath, vomiting, diarrhea, or other symptoms.  No treatments attempted at home.  Patient states this is similar to previous episode of acute bronchitis.  She had 2 negative COVID tests at home.  Her medical history includes diabetes hypertension, GERD.  The history is provided by the patient and medical records.    Past Medical History:  Diagnosis Date  . Diabetes ([place])   . GERD (gastroesophageal reflux disease)   . High blood pressure   . High cholesterol     Patient Active Problem List   Diagnosis Date Noted  . Mass of parotid gland 11/21/2014  . Multiple lung nodules on CT 10/23/2014  . Abnormal PFT 10/22/2013  . Cough 09/27/2013    Past Surgical History:  Procedure Laterality Date  . ABDOMINAL HYSTERECTOMY    . BREAST REDUCTION SURGERY     12 years ago  . CHOLECYSTECTOMY  2009  . CYSTOSCOPY N/A 12/01/2020   Procedure: CYSTOSCOPY;  Surgeon: Benjaman Kindler, MD;  Location: [facility] ORS;  Service: Gynecology;  Laterality: N/A;  . LAPAROSCOPIC OVARIAN CYSTECTOMY Bilateral 12/01/2020   Procedure: LAPAROSCOPIC OVARIAN CYSTECTOMY;  Surgeon: Benjaman Kindler, MD;  Location: [facility] ORS;  Service: Gynecology;  Laterality: Bilateral;  . REDUCTION MAMMAPLASTY Bilateral   . ROBOTIC ASSISTED TOTAL HYSTERECTOMY WITH BILATERAL SALPINGO OOPHERECTOMY Bilateral 12/01/2020   Procedure: XI ROBOTIC ASSISTED TOTAL HYSTERECTOMY WITH BILATERAL SALPINGECTOMY;  Surgeon: Benjaman Kindler, MD;  Location: [facility] ORS;  Service: Gynecology;  Laterality: Bilateral;  . VULVAR LESION REMOVAL N/A 12/01/2020   Procedure: VULVAR MOLE EXCISION;  Surgeon: Benjaman Kindler, MD;  Location: [facility] ORS;  Service:  Gynecology;  Laterality: N/A;    OB History    Gravida  2   Para  1   Term      Preterm      AB  1   Living  1     SAB  1   IAB      Ectopic      Multiple      Live Births           Obstetric Comments  1st Menstrual Cycle: 10 1st Pregnancy: 33          Home Medications    Prior to Admission medications   Medication Sig Start Date End Date Taking? Authorizing Provider  albuterol (VENTOLIN HFA) 108 (90 Base) MCG/ACT inhaler Inhale 1-2 puffs into the lungs every 6 (six) hours as needed for wheezing or shortness of breath. 01/17/21  Yes Sharion Balloon, NP  benzonatate (TESSALON) 100 MG capsule Take 1 capsule (100 mg total) by mouth 3 (three) times daily as needed for cough. 01/17/21  Yes Sharion Balloon, NP  predniSONE (DELTASONE) 10 MG tablet Take 6 tablets by mouth on day 1, 5 tabs on day 2, 4 tabs on day 3, 3 tabs on day 4, 2 tabs on day 5, 1 tab on day 6. Then stop. 01/17/21  Yes Sharion Balloon, NP  atorvastatin (LIPITOR) 40 MG tablet Take 40 mg by mouth daily. 09/07/20   [provider]  atorvastatin (LIPITOR) 40 MG tablet TAKE 1 TABLET BY MOUTH ONCE  DAILY 01/12/20 04/12/21  Tama High III, MD  canagliflozin [facility]) 300 MG TABS tablet TAKE 1 TABLET BY MOUTH ONCE DAILY 06/14/20 06/14/21  Tama High III, MD  cephALEXin (KEFLEX) 500 MG capsule Take 1 capsule (500 mg total) by mouth 2 (two) times daily for 7 days 01/02/21     Chlorpheniramine-Phenylephrine 4-10 MG tablet Take 1 tablet by mouth daily as needed for congestion or allergies.    [provider]  dicyclomine (BENTYL) 10 MG capsule Take 1 capsule (10 mg total) by mouth 4 (four) times daily -  before meals and at bedtime. Patient taking differently: Take 10 mg by mouth 3 (three) times daily before meals. 10/18/14   Robert Bellow, MD  dicyclomine (BENTYL) 10 MG capsule TAKE 1 CAPSULE BY MOUTH 3 TIMES DAILY. 01/12/20 04/12/21  Tama High III, MD  docusate sodium (COLACE) 100 MG capsule  Take 1 capsule (100 mg total) by mouth 2 (two) times daily. To keep stools soft 12/01/20   Benjaman Kindler, MD  fluconazole (DIFLUCAN) 150 MG tablet Take 1 tablet (150 mg total) by mouth once for 1 dose. May repeat tablet in 1 week 01/02/21     gabapentin (NEURONTIN) 800 MG tablet Take 1 tablet (800 mg total) by mouth at bedtime for 3 days, then up to 14 days as needed 12/01/20 12/15/20  Benjaman Kindler, MD  glimepiride (AMARYL) 4 MG tablet Take 8 mg by mouth daily with breakfast. 08/08/20   [provider]  glimepiride (AMARYL) 4 MG tablet TAKE 2 TABLETS BY MOUTH DAILY WITH BREAKFAST 08/08/20 08/08/21  Tama High III, MD  hydrochlorothiazide (HYDRODIURIL) 25 MG tablet Take 25 mg by mouth daily. 03/30/14   [provider]  INVOKANA 300 MG TABS tablet Take 300 mg by mouth daily. 09/07/20   [provider]  metFORMIN (GLUCOPHAGE) 1000 MG tablet Take 1,000 mg by mouth 2 (two) times daily with a meal.    [provider]  metFORMIN (GLUCOPHAGE) 1000 MG tablet TAKE 1 TABLET BY MOUTH TWICE DAILY 06/29/20 06/29/21  Tama High III, MD  omeprazole (PRILOSEC) 20 MG capsule Take 20 mg by mouth 2 (two) times daily. 09/07/20   [provider]  omeprazole (PRILOSEC) 20 MG capsule TAKE 1 CAPSULE BY MOUTH 2 TIMES DAILY. 09/07/20 09/07/21  Tama High III, MD  oxyCODONE (OXY IR/ROXICODONE) 5 MG immediate release tablet Take 1 tablet (5 mg total) by mouth every 4 (four) hours as needed for severe pain. 12/01/20   Benjaman Kindler, MD  telmisartan (MICARDIS) 40 MG tablet Take 40 mg by mouth daily. 10/20/20   [provider]  telmisartan (MICARDIS) 40 MG tablet TAKE 1 TABLET BY MOUTH ONCE DAILY. 06/09/20 06/09/21  Adin Hector, MD    Family History Family History  Problem Relation Age of Onset  . Diabetes Mother   . Colon polyps Father     Social History Social History   Tobacco Use  . Smoking status: Never Smoker  . Smokeless tobacco: Never Used  Substance  Use Topics  . Alcohol use: Yes    Alcohol/week: 0.0 standard drinks    Comment: rarely   . Drug use: No     Allergies   Tape   Review of Systems Review of Systems  Constitutional: Negative for chills and fever.  HENT: Positive for congestion and postnasal drip. Negative for ear pain and sore throat.   Respiratory: Positive for cough. Negative for shortness of breath.  Cardiovascular: Negative for chest pain and palpitations.  Gastrointestinal: Negative for abdominal pain, diarrhea and vomiting.  Skin: Negative for color change and rash.  All other systems reviewed and are negative.    Physical Exam Triage Vital Signs ED Triage Vitals  Enc Vitals Group     BP      Pulse      Resp      Temp      Temp src      SpO2      Weight      Height      Head Circumference      Peak Flow      Pain Score      Pain Loc      Pain Edu?      Excl. in [place]?    No data found.  Updated Vital Signs BP (!) 164/88   Pulse 92   Temp 98.6 F (37 C) (Oral)   Resp 18   Ht 6' (1.829 m)   Wt 250 lb (113.4 kg)   LMP 09/27/2014   SpO2 95%   BMI 33.91 kg/m   Visual Acuity Right Eye Distance:   Left Eye Distance:   Bilateral Distance:    Right Eye Near:   Left Eye Near:    Bilateral Near:     Physical Exam Vitals and nursing note reviewed.  Constitutional:      General: She is not in acute distress.    Appearance: She is well-developed. She is obese. She is not ill-appearing.  HENT:     Head: Normocephalic and atraumatic.     Right Ear: Tympanic membrane normal.     Left Ear: Tympanic membrane normal.     Nose: Nose normal.     Mouth/Throat:     Mouth: Mucous membranes are moist.     Pharynx: Oropharynx is clear.  Eyes:     Conjunctiva/sclera: Conjunctivae normal.  Cardiovascular:     Rate and Rhythm: Normal rate and regular rhythm.     Heart sounds: Normal heart sounds.  Pulmonary:     Effort: Pulmonary effort is normal. No respiratory distress.     Breath sounds:  Normal breath sounds.  Abdominal:     Palpations: Abdomen is soft.     Tenderness: There is no abdominal tenderness.  Musculoskeletal:     Cervical back: Neck supple.  Skin:    General: Skin is warm and dry.  Neurological:     General: No focal deficit present.     Mental Status: She is alert and oriented to person, place, and time.  Psychiatric:        Mood and Affect: Mood normal.        Behavior: Behavior normal.      UC Treatments / Results  Labs (all labs ordered are listed, but only abnormal results are displayed) Labs Reviewed  COVID-19, FLU A+B NAA    EKG   Radiology No results found.  Procedures Procedures (including critical care time)  Medications Ordered in UC Medications - No data to display  Initial Impression / Assessment and Plan / UC Course  I have reviewed the triage vital signs and the nursing notes.  Pertinent labs & imaging results that were available during my care of the patient were reviewed by me and considered in my medical decision making (see chart for details).   Acute bronchitis. Elevated blood pressure with known hypertension.   Treating bronchitis with albuterol inhaler and prednisone taper.  Patient  reports her previous episodes of bronchitis have required prednisone and that her blood sugar has not been affected by this.  Instructed her to monitor her blood sugar frequently while taking the prednisone as it can cause hypoglycemia.  Treating cough with Tessalon Perles.  COVID and influenza pending.  Instructed patient to self quarantine until the test results are back.  Instructed her to notify employee health of her symptoms.  Also discussed that her blood pressure is elevated today and needs to be rechecked by her PCP in 2 to 4 weeks.  Education provided on managing hypertension.  She agrees to plan of care.   Final Clinical Impressions(s) / UC Diagnoses   Final diagnoses:  Acute bronchitis, unspecified organism  Elevated blood  pressure reading in office with diagnosis of hypertension  Encounter for screening for COVID-19     Discharge Instructions     Use the albuterol inhaler and take the prednisone as directed.  Monitor your blood sugar frequently while taking the prednisone as it can cause elevated blood sugar.    Take the [facility] as needed for cough.    Your COVID and influenza tests are pending.  You should self quarantine until the test results are back.  Please report your symptoms to employee health.    Go to the emergency department if you have acute shortness of breath or other concerning symptoms.    Follow up with your primary care provider if your symptoms are not improving.        ED Prescriptions    Medication Sig Dispense Auth. Provider   predniSONE (DELTASONE) 10 MG tablet Take 6 tablets by mouth on day 1, 5 tabs on day 2, 4 tabs on day 3, 3 tabs on day 4, 2 tabs on day 5, 1 tab on day 6. Then stop. 21 tablet Sharion Balloon, NP   albuterol (VENTOLIN HFA) 108 (90 Base) MCG/ACT inhaler Inhale 1-2 puffs into the lungs every 6 (six) hours as needed for wheezing or shortness of breath. 18 g Sharion Balloon, NP   benzonatate (TESSALON) 100 MG capsule Take 1 capsule (100 mg total) by mouth 3 (three) times daily as needed for cough. 21 capsule Sharion Balloon, NP     PDMP not reviewed this encounter.   Sharion Balloon, NP 01/17/21 [phone]

## 2021-01-19 LAB — COVID-19, FLU A+B NAA
Influenza A, NAA: NOT DETECTED
Influenza B, NAA: NOT DETECTED
SARS-CoV-2, NAA: DETECTED — AB

## 2021-01-31 ENCOUNTER — Other Ambulatory Visit: Payer: Self-pay

## 2021-01-31 MED FILL — Telmisartan Tab 40 MG: ORAL | 30 days supply | Qty: 30 | Fill #1 | Status: AC

## 2021-03-01 MED FILL — Telmisartan Tab 40 MG: ORAL | 30 days supply | Qty: 30 | Fill #2 | Status: AC

## 2021-03-02 ENCOUNTER — Other Ambulatory Visit: Payer: Self-pay

## 2021-03-24 MED FILL — Metformin HCl Tab 1000 MG: ORAL | 90 days supply | Qty: 180 | Fill #1 | Status: AC

## 2021-03-26 ENCOUNTER — Other Ambulatory Visit: Payer: Self-pay

## 2021-04-11 ENCOUNTER — Other Ambulatory Visit: Payer: Self-pay

## 2021-04-11 MED ORDER — INVOKANA 300 MG PO TABS
ORAL_TABLET | Freq: Every day | ORAL | 3 refills | Status: AC
  Filled 2021-04-11: qty 90, 90d supply, fill #0

## 2021-04-11 MED ORDER — GLIMEPIRIDE 4 MG PO TABS
ORAL_TABLET | Freq: Every day | ORAL | 3 refills | Status: DC
  Filled 2021-04-11: qty 180, 90d supply, fill #0
  Filled 2021-08-22: qty 180, 90d supply, fill #1
  Filled 2021-12-05: qty 180, 90d supply, fill #2
  Filled 2022-03-10: qty 180, 90d supply, fill #3

## 2021-04-12 ENCOUNTER — Other Ambulatory Visit: Payer: Self-pay

## 2021-04-12 MED ORDER — GLIMEPIRIDE 4 MG PO TABS
ORAL_TABLET | ORAL | 1 refills | Status: AC
  Filled 2021-04-12: qty 180, 90d supply, fill #0

## 2021-04-12 MED ORDER — INVOKANA 300 MG PO TABS
ORAL_TABLET | ORAL | 1 refills | Status: AC
  Filled 2021-04-12: qty 90, 90d supply, fill #0

## 2021-04-15 MED FILL — Telmisartan Tab 40 MG: ORAL | 30 days supply | Qty: 30 | Fill #3 | Status: AC

## 2021-04-16 ENCOUNTER — Other Ambulatory Visit: Payer: Self-pay

## 2021-04-18 ENCOUNTER — Other Ambulatory Visit: Payer: Self-pay

## 2021-04-18 MED ORDER — FARXIGA 10 MG PO TABS
10.0000 mg | ORAL_TABLET | Freq: Every day | ORAL | 11 refills | Status: AC
  Filled 2021-04-18: qty 30, 30d supply, fill #0
  Filled 2021-06-04: qty 90, 90d supply, fill #1
  Filled 2021-09-12: qty 90, 90d supply, fill #2
  Filled 2021-12-05: qty 90, 90d supply, fill #3

## 2021-05-25 ENCOUNTER — Other Ambulatory Visit: Payer: Self-pay

## 2021-05-25 MED FILL — Telmisartan Tab 40 MG: ORAL | 30 days supply | Qty: 30 | Fill #4 | Status: AC

## 2021-06-05 ENCOUNTER — Other Ambulatory Visit: Payer: Self-pay

## 2021-06-08 ENCOUNTER — Other Ambulatory Visit: Payer: Self-pay

## 2021-06-08 MED FILL — Telmisartan Tab 40 MG: ORAL | 30 days supply | Qty: 30 | Fill #5 | Status: CN

## 2021-06-15 ENCOUNTER — Other Ambulatory Visit: Payer: Self-pay

## 2021-06-17 MED FILL — Omeprazole Cap Delayed Release 20 MG: ORAL | 90 days supply | Qty: 180 | Fill #0 | Status: AC

## 2021-06-18 ENCOUNTER — Other Ambulatory Visit: Payer: Self-pay

## 2021-06-22 ENCOUNTER — Other Ambulatory Visit: Payer: Self-pay

## 2021-06-22 MED ORDER — HYDROCHLOROTHIAZIDE 25 MG PO TABS
ORAL_TABLET | ORAL | 0 refills | Status: DC
  Filled 2021-06-22: qty 30, 30d supply, fill #0

## 2021-06-27 ENCOUNTER — Other Ambulatory Visit: Payer: Self-pay

## 2021-06-27 DIAGNOSIS — G5603 Carpal tunnel syndrome, bilateral upper limbs: Secondary | ICD-10-CM | POA: Diagnosis not present

## 2021-06-27 DIAGNOSIS — G5601 Carpal tunnel syndrome, right upper limb: Secondary | ICD-10-CM | POA: Diagnosis not present

## 2021-06-27 MED ORDER — METHYLPREDNISOLONE 4 MG PO TBPK
ORAL_TABLET | ORAL | 0 refills | Status: AC
  Filled 2021-06-27: qty 21, 6d supply, fill #0

## 2021-07-06 ENCOUNTER — Other Ambulatory Visit: Payer: Self-pay

## 2021-07-06 MED ORDER — TELMISARTAN 40 MG PO TABS
40.0000 mg | ORAL_TABLET | Freq: Every day | ORAL | 1 refills | Status: DC
  Filled 2021-07-06: qty 90, 90d supply, fill #0
  Filled 2021-11-01: qty 90, 90d supply, fill #1

## 2021-07-09 ENCOUNTER — Other Ambulatory Visit: Payer: Self-pay

## 2021-07-10 ENCOUNTER — Other Ambulatory Visit: Payer: Self-pay

## 2021-07-10 MED ORDER — IBUPROFEN 800 MG PO TABS
ORAL_TABLET | ORAL | 2 refills | Status: AC
  Filled 2021-07-10 – 2021-07-12 (×2): qty 90, 30d supply, fill #0

## 2021-07-12 ENCOUNTER — Other Ambulatory Visit: Payer: Self-pay

## 2021-07-27 ENCOUNTER — Other Ambulatory Visit: Payer: Self-pay

## 2021-07-27 MED ORDER — PREDNISONE 10 MG PO TABS
ORAL_TABLET | ORAL | 0 refills | Status: AC
  Filled 2021-07-27: qty 21, 6d supply, fill #0

## 2021-07-27 MED ORDER — PROMETHAZINE-DM 6.25-15 MG/5ML PO SYRP
ORAL_SOLUTION | ORAL | 0 refills | Status: AC
  Filled 2021-07-27: qty 100, 4d supply, fill #0

## 2021-07-30 ENCOUNTER — Other Ambulatory Visit: Payer: Self-pay

## 2021-07-31 ENCOUNTER — Other Ambulatory Visit: Payer: Self-pay

## 2021-07-31 MED ORDER — AZITHROMYCIN 250 MG PO TABS
ORAL_TABLET | ORAL | 0 refills | Status: AC
  Filled 2021-07-31: qty 6, 5d supply, fill #0

## 2021-08-01 ENCOUNTER — Other Ambulatory Visit: Payer: Self-pay

## 2021-08-01 MED ORDER — METFORMIN HCL 1000 MG PO TABS
1000.0000 mg | ORAL_TABLET | Freq: Two times a day (BID) | ORAL | 3 refills | Status: DC
  Filled 2021-08-01: qty 180, 90d supply, fill #0
  Filled 2021-12-05: qty 180, 90d supply, fill #1
  Filled 2022-03-29: qty 180, 90d supply, fill #2
  Filled 2022-07-30: qty 180, 90d supply, fill #3

## 2021-08-22 ENCOUNTER — Other Ambulatory Visit: Payer: Self-pay

## 2021-08-31 DIAGNOSIS — E118 Type 2 diabetes mellitus with unspecified complications: Secondary | ICD-10-CM | POA: Diagnosis not present

## 2021-08-31 DIAGNOSIS — E785 Hyperlipidemia, unspecified: Secondary | ICD-10-CM | POA: Diagnosis not present

## 2021-09-06 ENCOUNTER — Other Ambulatory Visit: Payer: Self-pay

## 2021-09-06 DIAGNOSIS — R801 Persistent proteinuria, unspecified: Secondary | ICD-10-CM | POA: Diagnosis not present

## 2021-09-06 DIAGNOSIS — I10 Essential (primary) hypertension: Secondary | ICD-10-CM | POA: Diagnosis not present

## 2021-09-06 DIAGNOSIS — E785 Hyperlipidemia, unspecified: Secondary | ICD-10-CM | POA: Diagnosis not present

## 2021-09-06 DIAGNOSIS — E118 Type 2 diabetes mellitus with unspecified complications: Secondary | ICD-10-CM | POA: Diagnosis not present

## 2021-09-06 MED ORDER — OZEMPIC (0.25 OR 0.5 MG/DOSE) 2 MG/1.5ML ~~LOC~~ SOPN
PEN_INJECTOR | SUBCUTANEOUS | 0 refills | Status: DC
  Filled 2021-09-06: qty 1.5, 30d supply, fill #0

## 2021-09-12 ENCOUNTER — Other Ambulatory Visit: Payer: Self-pay | Admitting: Emergency Medicine

## 2021-09-12 ENCOUNTER — Other Ambulatory Visit: Payer: Self-pay

## 2021-09-13 ENCOUNTER — Other Ambulatory Visit: Payer: Self-pay

## 2021-09-13 MED ORDER — ATORVASTATIN CALCIUM 40 MG PO TABS
40.0000 mg | ORAL_TABLET | Freq: Every day | ORAL | 1 refills | Status: DC
  Filled 2021-09-13: qty 90, 90d supply, fill #0
  Filled 2022-03-05: qty 90, 90d supply, fill #1

## 2021-09-13 MED ORDER — HYDROCHLOROTHIAZIDE 25 MG PO TABS
25.0000 mg | ORAL_TABLET | Freq: Every day | ORAL | 1 refills | Status: DC
  Filled 2021-09-13: qty 90, 90d supply, fill #0
  Filled 2022-03-10: qty 90, 90d supply, fill #1

## 2021-09-14 ENCOUNTER — Other Ambulatory Visit: Payer: Self-pay

## 2021-10-04 ENCOUNTER — Other Ambulatory Visit: Payer: Self-pay

## 2021-10-04 MED ORDER — OZEMPIC (0.25 OR 0.5 MG/DOSE) 2 MG/1.5ML ~~LOC~~ SOPN
PEN_INJECTOR | SUBCUTANEOUS | 3 refills | Status: AC
  Filled 2021-10-04: qty 4.5, 84d supply, fill #0

## 2021-10-23 ENCOUNTER — Other Ambulatory Visit: Payer: Self-pay

## 2021-10-23 ENCOUNTER — Telehealth: Payer: 59 | Admitting: Nurse Practitioner

## 2021-10-23 DIAGNOSIS — I10 Essential (primary) hypertension: Secondary | ICD-10-CM | POA: Insufficient documentation

## 2021-10-23 DIAGNOSIS — E785 Hyperlipidemia, unspecified: Secondary | ICD-10-CM | POA: Insufficient documentation

## 2021-10-23 DIAGNOSIS — K047 Periapical abscess without sinus: Secondary | ICD-10-CM | POA: Diagnosis not present

## 2021-10-23 DIAGNOSIS — Z86018 Personal history of other benign neoplasm: Secondary | ICD-10-CM | POA: Insufficient documentation

## 2021-10-23 MED ORDER — AMOXICILLIN 500 MG PO CAPS
500.0000 mg | ORAL_CAPSULE | Freq: Three times a day (TID) | ORAL | 0 refills | Status: AC
  Filled 2021-10-23: qty 30, 10d supply, fill #0

## 2021-10-23 NOTE — Progress Notes (Signed)
E-Visit for Dental Pain  We are sorry that you are not feeling well.  Here is how we plan to help!  Based on what you have shared with me in the questionnaire, it sounds like you have an infection around your broken tooth   Amoxicillin 500mg  3 times per day for 10 days  It is imperative that you see a dentist within 10 days of this eVisit to determine the cause of the dental pain and be sure it is adequately treated  A toothache or tooth pain is caused when the nerve in the root of a tooth or surrounding a tooth is irritated. Dental (tooth) infection, decay, injury, or loss of a tooth are the most common causes of dental pain. Pain may also occur after an extraction (tooth is pulled out). Pain sometimes originates from other areas and radiates to the jaw, thus appearing to be tooth pain.Bacteria growing inside your mouth can contribute to gum disease and dental decay, both of which can cause pain. A toothache occurs from inflammation of the central portion of the tooth called pulp. The pulp contains nerve endings that are very sensitive to pain. Inflammation to the pulp or pulpitis may be caused by dental cavities, trauma, and infection.    HOME CARE:   For toothaches: Over-the-counter pain medications such as acetaminophen or ibuprofen may be used. Take these as directed on the package while you arrange for a dental appointment. Avoid very cold or hot foods, because they may make the pain worse. You may get relief from biting on a cotton ball soaked in oil of cloves. You can get oil of cloves at most drug stores.  For jaw pain:  Aspirin may be helpful for problems in the joint of the jaw in adults. If pain happens every time you open your mouth widely, the temporomandibular joint (TMJ) may be the source of the pain. Yawning or taking a large bite of food may worsen the pain. An appointment with your doctor or dentist will help you find the cause.     GET HELP RIGHT AWAY IF:  You have a  high fever or chills If you have had a recent head or face injury and develop headache, light headedness, nausea, vomiting, or other symptoms that concern you after an injury to your face or mouth, you could have a more serious injury in addition to your dental injury. A facial rash associated with a toothache: This condition may improve with medication. Contact your doctor for them to decide what is appropriate. Any jaw pain occurring with chest pain: Although jaw pain is most commonly caused by dental disease, it is sometimes referred pain from other areas. People with heart disease, especially people who have had stents placed, people with diabetes, or those who have had heart surgery may have jaw pain as a symptom of heart attack or angina. If your jaw or tooth pain is associated with lightheadedness, sweating, or shortness of breath, you should see a doctor as soon as possible. Trouble swallowing or excessive pain or bleeding from gums: If you have a history of a weakened immune system, diabetes, or steroid use, you may be more susceptible to infections. Infections can often be more severe and extensive or caused by unusual organisms. Dental and gum infections in people with these conditions may require more aggressive treatment. An abscess may need draining or IV antibiotics, for example.  MAKE SURE YOU   Understand these instructions. Will watch your condition. Will get help  right away if you are not doing well or get worse.  Thank you for choosing an e-visit.  Your e-visit answers were reviewed by a board certified advanced clinical practitioner to complete your personal care plan. Depending upon the condition, your plan could have included both over the counter or prescription medications.  Please review your pharmacy choice. Make sure the pharmacy is open so you can pick up prescription now. If there is a problem, you may contact your provider through [employer] and have the  prescription routed to another pharmacy.  Your safety is important to [place]. If you have drug allergies check your prescription carefully.   For the next 24 hours you can use MyChart to ask questions about today's visit, request a non-urgent call back, or ask for a work or school excuse. You will get an email in the next two days asking about your experience. I hope that your e-visit has been valuable and will speed your recovery.   I spent approximately 5 minutes reviewing the patient's history, current symptoms and coordinating their plan of care today.    Meds ordered this encounter  Medications   amoxicillin (AMOXIL) 500 MG capsule    Sig: Take 1 capsule (500 mg total) by mouth 3 (three) times daily for 10 days.    Dispense:  30 capsule    Refill:  0

## 2021-11-01 ENCOUNTER — Other Ambulatory Visit: Payer: Self-pay

## 2021-11-20 ENCOUNTER — Other Ambulatory Visit: Payer: Self-pay

## 2021-11-20 MED ORDER — OZEMPIC (1 MG/DOSE) 4 MG/3ML ~~LOC~~ SOPN
PEN_INJECTOR | SUBCUTANEOUS | 3 refills | Status: AC
  Filled 2021-11-20: qty 9, 84d supply, fill #0
  Filled 2021-12-20: qty 3, 28d supply, fill #0

## 2021-11-23 ENCOUNTER — Other Ambulatory Visit: Payer: Self-pay

## 2021-12-05 ENCOUNTER — Other Ambulatory Visit: Payer: Self-pay

## 2021-12-06 ENCOUNTER — Other Ambulatory Visit: Payer: Self-pay

## 2021-12-06 MED ORDER — OMEPRAZOLE 20 MG PO CPDR
20.0000 mg | DELAYED_RELEASE_CAPSULE | Freq: Two times a day (BID) | ORAL | 3 refills | Status: AC
  Filled 2021-12-06: qty 180, 90d supply, fill #0

## 2021-12-20 ENCOUNTER — Other Ambulatory Visit: Payer: Self-pay

## 2022-01-09 ENCOUNTER — Other Ambulatory Visit: Payer: Self-pay

## 2022-01-09 DIAGNOSIS — E118 Type 2 diabetes mellitus with unspecified complications: Secondary | ICD-10-CM | POA: Diagnosis not present

## 2022-01-09 DIAGNOSIS — E785 Hyperlipidemia, unspecified: Secondary | ICD-10-CM | POA: Diagnosis not present

## 2022-01-09 DIAGNOSIS — I10 Essential (primary) hypertension: Secondary | ICD-10-CM | POA: Diagnosis not present

## 2022-01-10 ENCOUNTER — Other Ambulatory Visit: Payer: Self-pay

## 2022-01-10 MED ORDER — TELMISARTAN 40 MG PO TABS
40.0000 mg | ORAL_TABLET | Freq: Every day | ORAL | 1 refills | Status: DC
  Filled 2022-01-10 – 2022-02-22 (×2): qty 90, 90d supply, fill #0
  Filled 2022-06-12: qty 90, 90d supply, fill #1

## 2022-01-17 ENCOUNTER — Other Ambulatory Visit: Payer: Self-pay

## 2022-01-17 DIAGNOSIS — I10 Essential (primary) hypertension: Secondary | ICD-10-CM | POA: Diagnosis not present

## 2022-01-17 DIAGNOSIS — Z1231 Encounter for screening mammogram for malignant neoplasm of breast: Secondary | ICD-10-CM | POA: Diagnosis not present

## 2022-01-17 DIAGNOSIS — K760 Fatty (change of) liver, not elsewhere classified: Secondary | ICD-10-CM | POA: Diagnosis not present

## 2022-01-17 DIAGNOSIS — E118 Type 2 diabetes mellitus with unspecified complications: Secondary | ICD-10-CM | POA: Diagnosis not present

## 2022-01-17 DIAGNOSIS — Z0000 Encounter for general adult medical examination without abnormal findings: Secondary | ICD-10-CM | POA: Diagnosis not present

## 2022-01-17 DIAGNOSIS — E785 Hyperlipidemia, unspecified: Secondary | ICD-10-CM | POA: Diagnosis not present

## 2022-01-17 DIAGNOSIS — R601 Generalized edema: Secondary | ICD-10-CM | POA: Diagnosis not present

## 2022-01-17 DIAGNOSIS — Z1211 Encounter for screening for malignant neoplasm of colon: Secondary | ICD-10-CM | POA: Diagnosis not present

## 2022-01-17 DIAGNOSIS — R801 Persistent proteinuria, unspecified: Secondary | ICD-10-CM | POA: Diagnosis not present

## 2022-01-17 MED ORDER — ONDANSETRON HCL 4 MG PO TABS
ORAL_TABLET | ORAL | 5 refills | Status: AC
  Filled 2022-01-17: qty 20, 7d supply, fill #0

## 2022-01-17 MED ORDER — OZEMPIC (2 MG/DOSE) 8 MG/3ML ~~LOC~~ SOPN
PEN_INJECTOR | SUBCUTANEOUS | 11 refills | Status: DC
  Filled 2022-01-17: qty 3, 28d supply, fill #0
  Filled 2022-02-14: qty 3, 28d supply, fill #1
  Filled 2022-03-10: qty 3, 28d supply, fill #2
  Filled 2022-04-12: qty 3, 28d supply, fill #3
  Filled 2022-05-08: qty 3, 28d supply, fill #4
  Filled 2022-06-06: qty 3, 28d supply, fill #5
  Filled 2022-07-04: qty 3, 28d supply, fill #6
  Filled 2022-07-30: qty 3, 28d supply, fill #7
  Filled 2022-08-30: qty 3, 28d supply, fill #8
  Filled 2022-09-25: qty 3, 28d supply, fill #9
  Filled 2022-10-24: qty 3, 28d supply, fill #10
  Filled 2022-11-21: qty 3, 28d supply, fill #11

## 2022-01-17 MED ORDER — RABEPRAZOLE SODIUM 20 MG PO TBEC
DELAYED_RELEASE_TABLET | ORAL | 11 refills | Status: DC
  Filled 2022-01-17: qty 30, 30d supply, fill #0
  Filled 2022-02-22: qty 30, 30d supply, fill #1
  Filled 2022-03-29: qty 30, 30d supply, fill #2
  Filled 2022-05-08: qty 30, 30d supply, fill #3
  Filled 2022-06-12: qty 30, 30d supply, fill #4
  Filled 2022-07-30: qty 30, 30d supply, fill #5
  Filled 2022-08-30: qty 30, 30d supply, fill #6
  Filled 2022-09-25: qty 30, 30d supply, fill #7
  Filled 2022-10-24: qty 30, 30d supply, fill #8
  Filled 2022-12-06: qty 30, 30d supply, fill #9

## 2022-02-14 ENCOUNTER — Other Ambulatory Visit: Payer: Self-pay

## 2022-02-15 ENCOUNTER — Other Ambulatory Visit: Payer: Self-pay

## 2022-02-15 MED ORDER — METHYLPREDNISOLONE 4 MG PO TBPK
ORAL_TABLET | ORAL | 1 refills | Status: AC
  Filled 2022-02-15: qty 21, 6d supply, fill #0

## 2022-02-22 ENCOUNTER — Other Ambulatory Visit: Payer: Self-pay

## 2022-03-06 ENCOUNTER — Other Ambulatory Visit: Payer: Self-pay

## 2022-03-10 ENCOUNTER — Other Ambulatory Visit: Payer: Self-pay

## 2022-03-11 ENCOUNTER — Other Ambulatory Visit: Payer: Self-pay

## 2022-03-29 ENCOUNTER — Other Ambulatory Visit: Payer: Self-pay

## 2022-04-12 ENCOUNTER — Other Ambulatory Visit: Payer: Self-pay

## 2022-04-19 DIAGNOSIS — E118 Type 2 diabetes mellitus with unspecified complications: Secondary | ICD-10-CM | POA: Diagnosis not present

## 2022-04-19 DIAGNOSIS — E785 Hyperlipidemia, unspecified: Secondary | ICD-10-CM | POA: Diagnosis not present

## 2022-04-25 DIAGNOSIS — I10 Essential (primary) hypertension: Secondary | ICD-10-CM | POA: Diagnosis not present

## 2022-04-25 DIAGNOSIS — R801 Persistent proteinuria, unspecified: Secondary | ICD-10-CM | POA: Diagnosis not present

## 2022-04-25 DIAGNOSIS — E118 Type 2 diabetes mellitus with unspecified complications: Secondary | ICD-10-CM | POA: Diagnosis not present

## 2022-04-25 DIAGNOSIS — K760 Fatty (change of) liver, not elsewhere classified: Secondary | ICD-10-CM | POA: Diagnosis not present

## 2022-04-25 DIAGNOSIS — R601 Generalized edema: Secondary | ICD-10-CM | POA: Diagnosis not present

## 2022-04-25 DIAGNOSIS — E785 Hyperlipidemia, unspecified: Secondary | ICD-10-CM | POA: Diagnosis not present

## 2022-05-08 ENCOUNTER — Other Ambulatory Visit: Payer: Self-pay

## 2022-05-09 ENCOUNTER — Other Ambulatory Visit: Payer: Self-pay

## 2022-05-09 MED ORDER — FARXIGA 10 MG PO TABS
10.0000 mg | ORAL_TABLET | Freq: Every day | ORAL | 11 refills | Status: DC
  Filled 2022-05-09: qty 30, 30d supply, fill #0
  Filled 2022-07-09: qty 30, 30d supply, fill #1
  Filled 2022-08-14: qty 30, 30d supply, fill #2
  Filled 2022-09-25: qty 30, 30d supply, fill #3
  Filled 2022-10-24: qty 30, 30d supply, fill #4
  Filled 2022-12-06: qty 30, 30d supply, fill #5
  Filled 2023-01-24: qty 30, 30d supply, fill #6
  Filled 2023-03-10: qty 30, 30d supply, fill #7
  Filled 2023-04-14: qty 30, 30d supply, fill #8

## 2022-05-09 MED ORDER — ATORVASTATIN CALCIUM 40 MG PO TABS
40.0000 mg | ORAL_TABLET | Freq: Every day | ORAL | 1 refills | Status: DC
  Filled 2022-05-09: qty 90, 90d supply, fill #0
  Filled 2022-10-24: qty 90, 90d supply, fill #1

## 2022-05-09 MED ORDER — GLIMEPIRIDE 4 MG PO TABS
8.0000 mg | ORAL_TABLET | Freq: Every day | ORAL | 3 refills | Status: DC
  Filled 2022-05-09 – 2022-05-29 (×2): qty 180, 90d supply, fill #0
  Filled 2022-08-14: qty 180, 90d supply, fill #1
  Filled 2022-12-06: qty 180, 90d supply, fill #2
  Filled 2023-03-21: qty 180, 90d supply, fill #3

## 2022-05-16 ENCOUNTER — Other Ambulatory Visit: Payer: Self-pay

## 2022-05-20 ENCOUNTER — Other Ambulatory Visit: Payer: Self-pay

## 2022-05-21 ENCOUNTER — Other Ambulatory Visit: Payer: Self-pay

## 2022-05-21 MED ORDER — FARXIGA 10 MG PO TABS
10.0000 mg | ORAL_TABLET | Freq: Every day | ORAL | 3 refills | Status: AC
  Filled 2022-05-21: qty 90, 90d supply, fill #0

## 2022-05-29 ENCOUNTER — Other Ambulatory Visit: Payer: Self-pay

## 2022-06-06 ENCOUNTER — Other Ambulatory Visit: Payer: Self-pay

## 2022-06-11 ENCOUNTER — Other Ambulatory Visit: Payer: Self-pay

## 2022-06-11 MED ORDER — FLUCONAZOLE 150 MG PO TABS
ORAL_TABLET | ORAL | 1 refills | Status: AC
  Filled 2022-06-11: qty 1, 1d supply, fill #0
  Filled 2022-07-10: qty 1, 1d supply, fill #1

## 2022-06-13 ENCOUNTER — Other Ambulatory Visit: Payer: Self-pay

## 2022-07-04 ENCOUNTER — Other Ambulatory Visit: Payer: Self-pay

## 2022-07-09 ENCOUNTER — Other Ambulatory Visit: Payer: Self-pay

## 2022-07-10 ENCOUNTER — Other Ambulatory Visit: Payer: Self-pay

## 2022-07-30 ENCOUNTER — Other Ambulatory Visit: Payer: Self-pay

## 2022-07-31 ENCOUNTER — Other Ambulatory Visit: Payer: Self-pay

## 2022-07-31 MED ORDER — DICYCLOMINE HCL 10 MG PO CAPS
10.0000 mg | ORAL_CAPSULE | Freq: Three times a day (TID) | ORAL | 3 refills | Status: DC
  Filled 2022-07-31: qty 270, 90d supply, fill #0
  Filled 2023-05-21: qty 270, 90d supply, fill #1

## 2022-08-21 ENCOUNTER — Other Ambulatory Visit: Payer: Self-pay

## 2022-08-21 MED ORDER — AZITHROMYCIN 250 MG PO TABS
ORAL_TABLET | ORAL | 0 refills | Status: AC
  Filled 2022-08-21: qty 6, 5d supply, fill #0

## 2022-08-21 MED ORDER — BENZONATATE 200 MG PO CAPS
ORAL_CAPSULE | ORAL | 0 refills | Status: AC
  Filled 2022-08-21: qty 20, 7d supply, fill #0

## 2022-09-10 IMAGING — MG MM DIGITAL SCREENING BILAT W/ TOMO AND CAD
8 series · 8 of 24 positions shown · non-contrast
Comparison: Previous exam(s).

CLINICAL DATA: Screening.

EXAM:
DIGITAL SCREENING BILATERAL MAMMOGRAM WITH TOMO AND CAD

[L CC synth-2D]
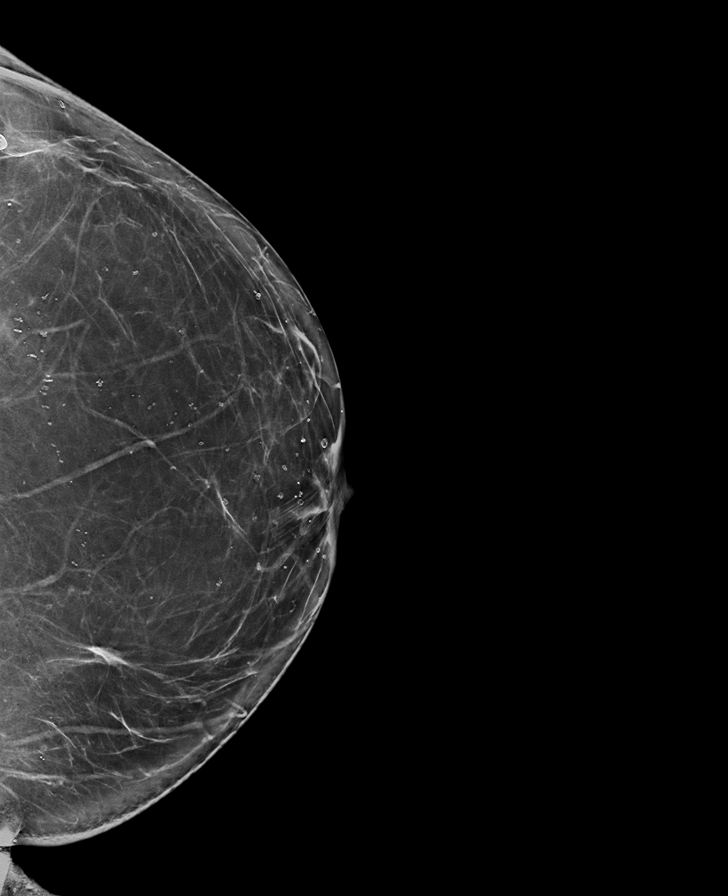

[R MLO synth-2D]
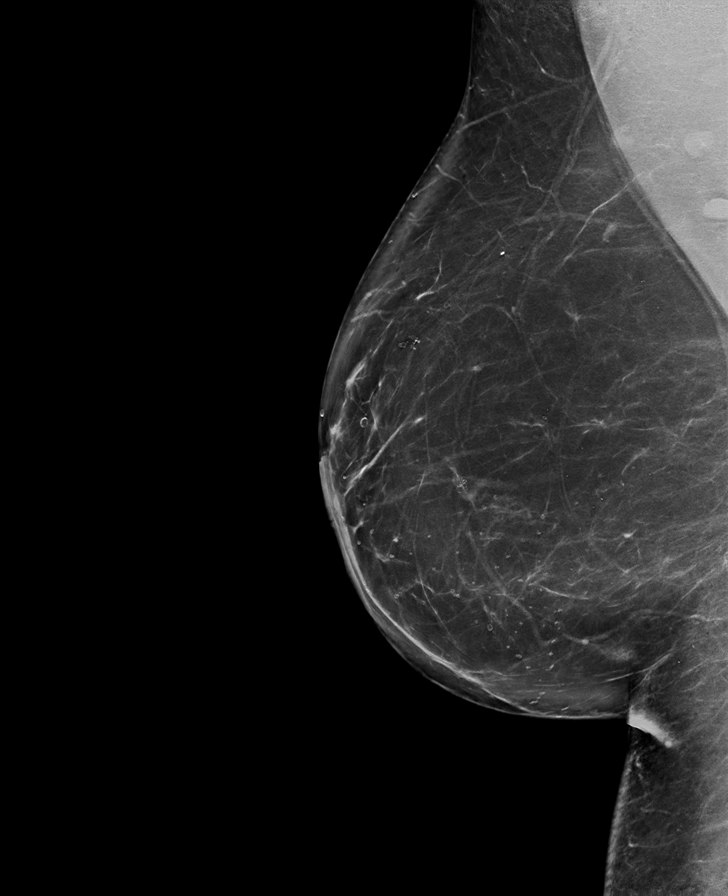

[R CC synth-2D]
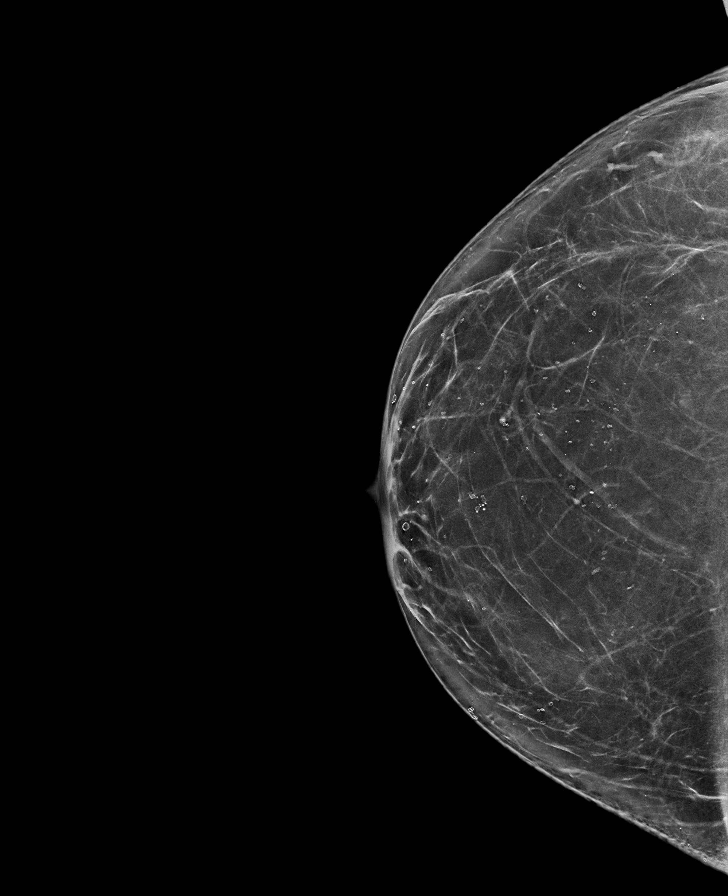

[L MLO synth-2D]
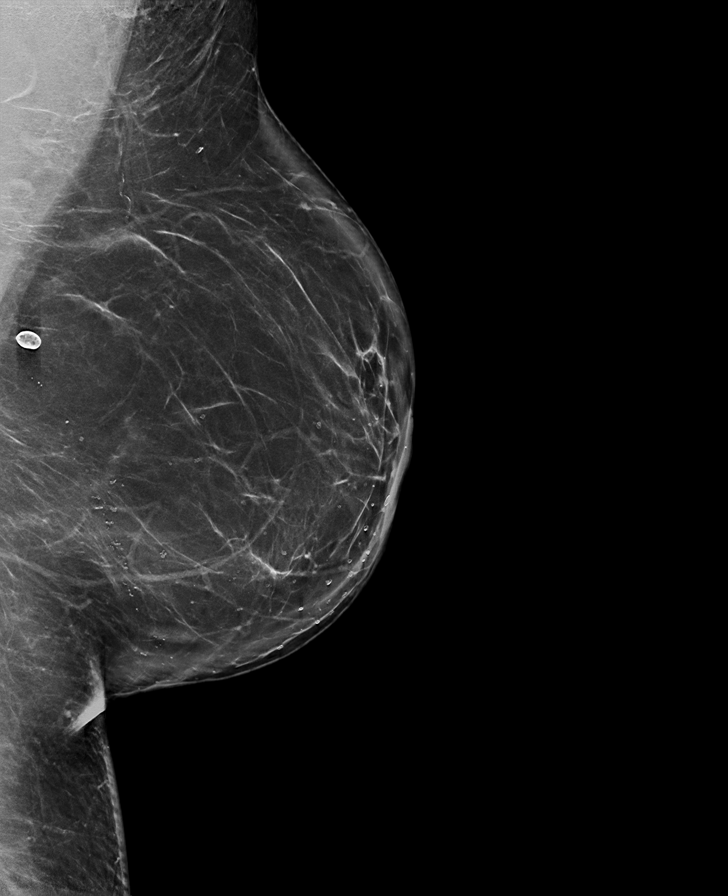

[R MLO tomo · tomo slice 51/101.0]
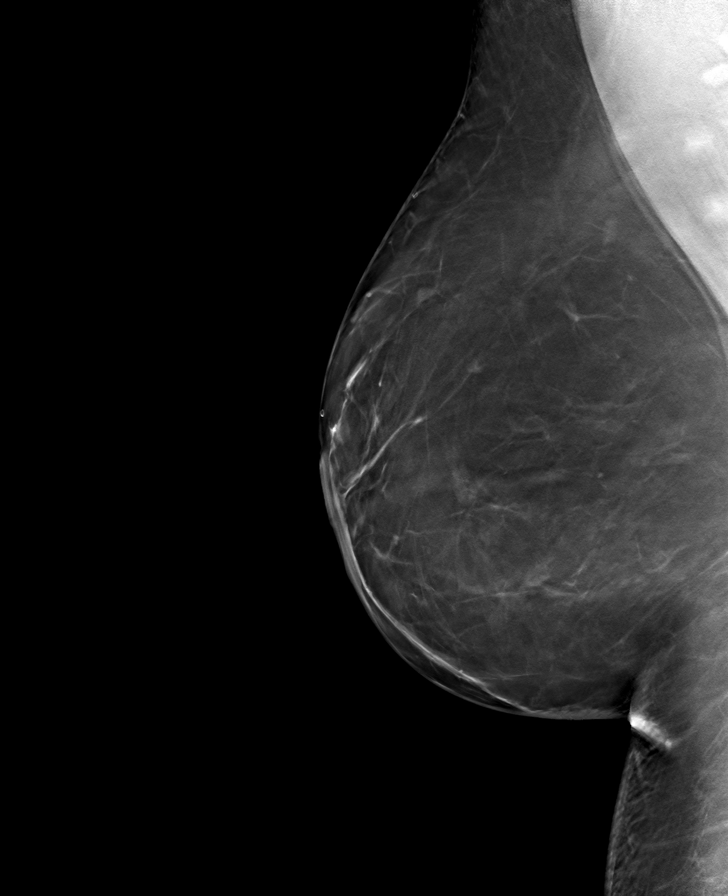

[R CC tomo · tomo slice 41/82.0]
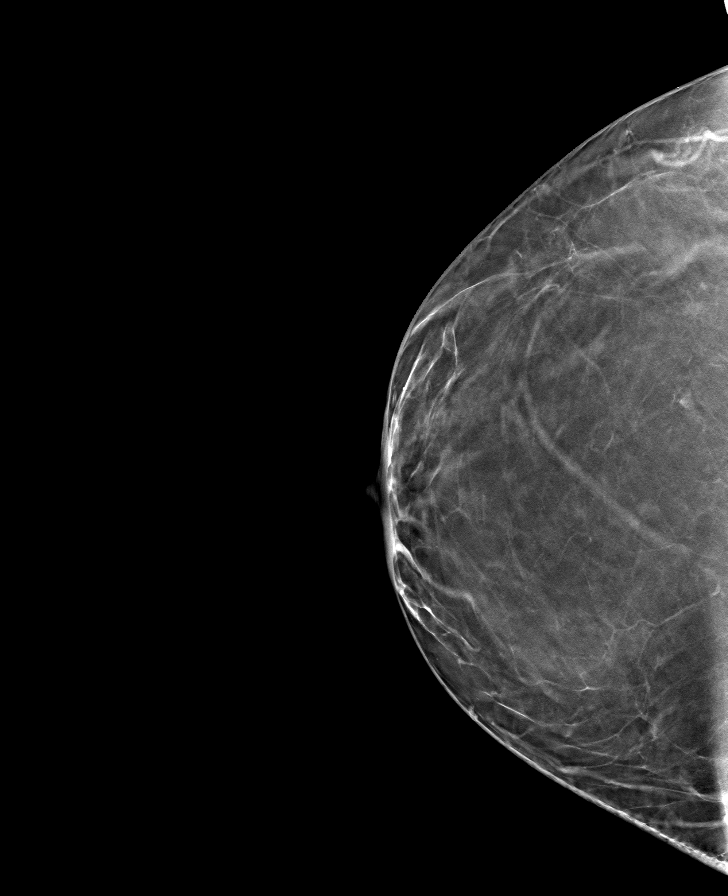

[L CC tomo · tomo slice 43/86.0]
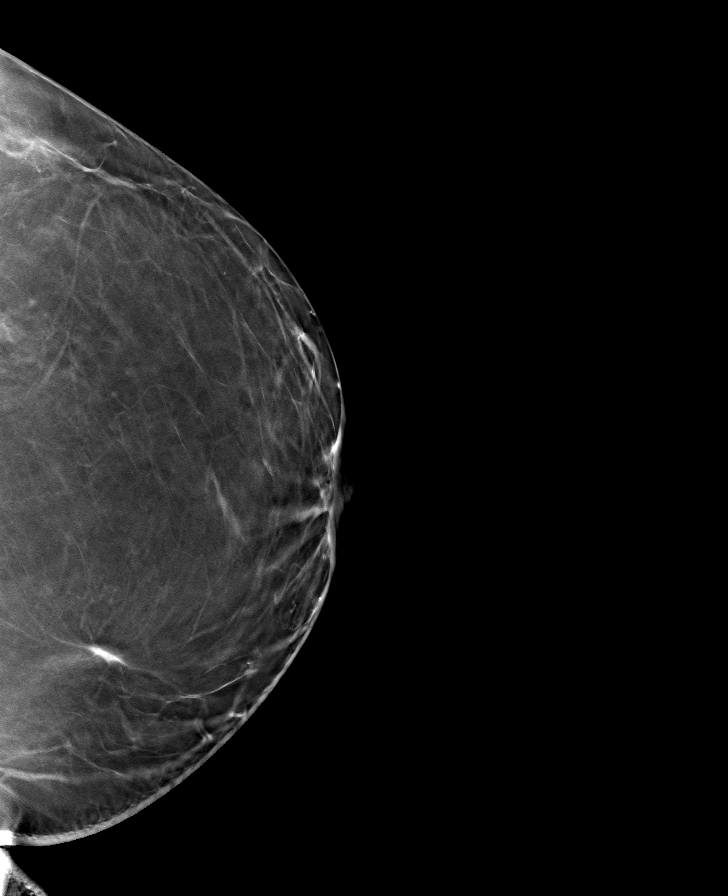

[L MLO tomo · tomo slice 51/100.0]
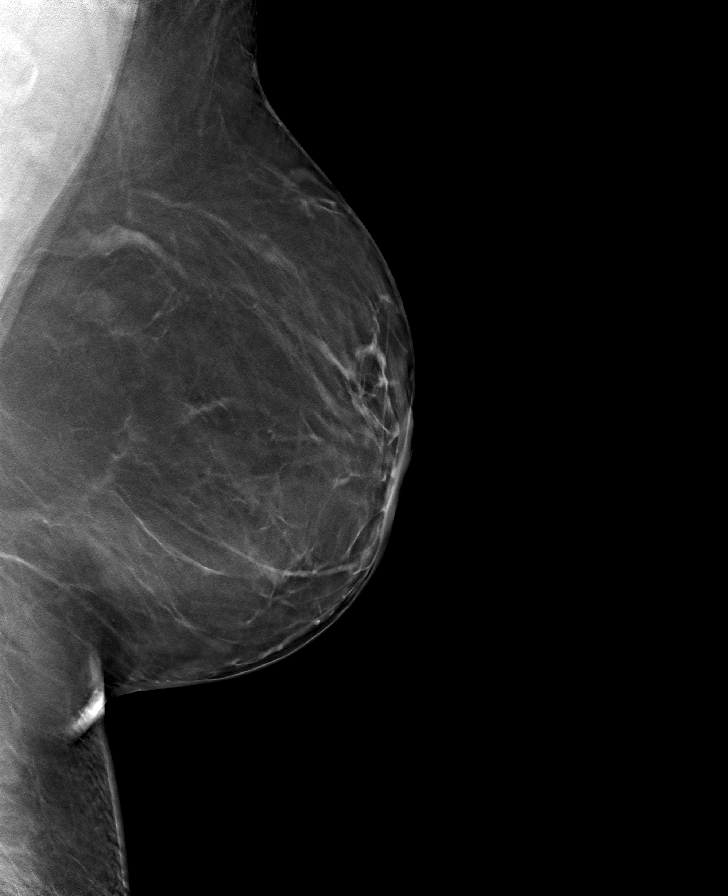

[8 of 24 positions shown; findings below may reference images not displayed]

ACR Breast Density Category b: There are scattered areas of
fibroglandular density.
FINDINGS: There are no findings suspicious for malignancy. The images were
evaluated with computer-aided detection.
IMPRESSION: No mammographic evidence of malignancy. A result letter of this
screening mammogram will be mailed directly to the patient.

RECOMMENDATION:
Screening mammogram in one year. (Code:ZP-7-VX7)

BI-RADS CATEGORY  1: Negative.

## 2022-09-25 ENCOUNTER — Other Ambulatory Visit: Payer: Self-pay

## 2022-09-25 MED ORDER — HYDROCHLOROTHIAZIDE 25 MG PO TABS
25.0000 mg | ORAL_TABLET | Freq: Every day | ORAL | 1 refills | Status: DC
  Filled 2022-09-25: qty 90, 90d supply, fill #0
  Filled 2023-01-24: qty 90, 90d supply, fill #1

## 2022-09-25 MED ORDER — TELMISARTAN 40 MG PO TABS
40.0000 mg | ORAL_TABLET | Freq: Every day | ORAL | 1 refills | Status: AC
  Filled 2022-09-25: qty 90, 90d supply, fill #0
  Filled 2023-01-24: qty 90, 90d supply, fill #1

## 2022-09-27 ENCOUNTER — Other Ambulatory Visit: Payer: Self-pay

## 2022-09-27 DIAGNOSIS — Z03818 Encounter for observation for suspected exposure to other biological agents ruled out: Secondary | ICD-10-CM | POA: Diagnosis not present

## 2022-09-27 DIAGNOSIS — J069 Acute upper respiratory infection, unspecified: Secondary | ICD-10-CM | POA: Diagnosis not present

## 2022-09-27 DIAGNOSIS — J111 Influenza due to unidentified influenza virus with other respiratory manifestations: Secondary | ICD-10-CM | POA: Diagnosis not present

## 2022-09-27 MED ORDER — OSELTAMIVIR PHOSPHATE 75 MG PO CAPS
75.0000 mg | ORAL_CAPSULE | Freq: Two times a day (BID) | ORAL | 0 refills | Status: AC
  Filled 2022-09-27: qty 10, 5d supply, fill #0

## 2022-09-27 MED ORDER — PROMETHAZINE-DM 6.25-15 MG/5ML PO SYRP
5.0000 mL | ORAL_SOLUTION | Freq: Four times a day (QID) | ORAL | 0 refills | Status: AC | PRN
  Filled 2022-09-27: qty 118, 6d supply, fill #0

## 2022-09-27 MED ORDER — FLUTICASONE PROPIONATE 50 MCG/ACT NA SUSP
2.0000 | Freq: Every day | NASAL | 0 refills | Status: AC
  Filled 2022-09-27: qty 16, 30d supply, fill #0

## 2022-09-27 MED ORDER — PREDNISONE 20 MG PO TABS
20.0000 mg | ORAL_TABLET | Freq: Two times a day (BID) | ORAL | 0 refills | Status: AC
  Filled 2022-09-27: qty 10, 5d supply, fill #0

## 2022-09-27 MED ORDER — ALBUTEROL SULFATE HFA 108 (90 BASE) MCG/ACT IN AERS
2.0000 | INHALATION_SPRAY | RESPIRATORY_TRACT | 0 refills | Status: AC | PRN
  Filled 2022-09-27: qty 6.7, 16d supply, fill #0

## 2022-10-24 ENCOUNTER — Other Ambulatory Visit: Payer: Self-pay

## 2022-10-24 MED ORDER — METFORMIN HCL 1000 MG PO TABS
1000.0000 mg | ORAL_TABLET | Freq: Two times a day (BID) | ORAL | 0 refills | Status: DC
  Filled 2022-11-09: qty 180, 90d supply, fill #0

## 2022-11-07 DIAGNOSIS — E785 Hyperlipidemia, unspecified: Secondary | ICD-10-CM | POA: Diagnosis not present

## 2022-11-07 DIAGNOSIS — E118 Type 2 diabetes mellitus with unspecified complications: Secondary | ICD-10-CM | POA: Diagnosis not present

## 2022-11-09 ENCOUNTER — Other Ambulatory Visit: Payer: Self-pay

## 2022-11-15 DIAGNOSIS — I10 Essential (primary) hypertension: Secondary | ICD-10-CM | POA: Diagnosis not present

## 2022-11-15 DIAGNOSIS — R601 Generalized edema: Secondary | ICD-10-CM | POA: Diagnosis not present

## 2022-11-15 DIAGNOSIS — E785 Hyperlipidemia, unspecified: Secondary | ICD-10-CM | POA: Diagnosis not present

## 2022-11-15 DIAGNOSIS — E118 Type 2 diabetes mellitus with unspecified complications: Secondary | ICD-10-CM | POA: Diagnosis not present

## 2022-11-15 DIAGNOSIS — R801 Persistent proteinuria, unspecified: Secondary | ICD-10-CM | POA: Diagnosis not present

## 2022-11-15 DIAGNOSIS — K760 Fatty (change of) liver, not elsewhere classified: Secondary | ICD-10-CM | POA: Diagnosis not present

## 2022-11-15 DIAGNOSIS — Z1211 Encounter for screening for malignant neoplasm of colon: Secondary | ICD-10-CM | POA: Diagnosis not present

## 2022-11-21 ENCOUNTER — Other Ambulatory Visit: Payer: Self-pay

## 2022-12-19 ENCOUNTER — Other Ambulatory Visit: Payer: Self-pay

## 2022-12-19 MED ORDER — OZEMPIC (2 MG/DOSE) 8 MG/3ML ~~LOC~~ SOPN
2.0000 mg | PEN_INJECTOR | SUBCUTANEOUS | 11 refills | Status: DC
  Filled 2022-12-19: qty 9, 84d supply, fill #0
  Filled 2023-03-21: qty 9, 84d supply, fill #1
  Filled 2023-06-11: qty 9, 84d supply, fill #2
  Filled 2023-08-26: qty 9, 84d supply, fill #3

## 2023-01-24 ENCOUNTER — Other Ambulatory Visit: Payer: Self-pay

## 2023-01-24 MED ORDER — METFORMIN HCL 1000 MG PO TABS
1000.0000 mg | ORAL_TABLET | Freq: Two times a day (BID) | ORAL | 1 refills | Status: DC
  Filled 2023-01-24: qty 180, 90d supply, fill #0
  Filled 2023-05-21: qty 180, 90d supply, fill #1

## 2023-01-24 MED ORDER — RABEPRAZOLE SODIUM 20 MG PO TBEC
20.0000 mg | DELAYED_RELEASE_TABLET | Freq: Every day | ORAL | 3 refills | Status: AC
  Filled 2023-01-24: qty 90, 90d supply, fill #0
  Filled 2023-05-21: qty 90, 90d supply, fill #1
  Filled 2023-08-18: qty 90, 90d supply, fill #2
  Filled 2023-12-04: qty 90, 90d supply, fill #3

## 2023-03-10 ENCOUNTER — Other Ambulatory Visit: Payer: Self-pay

## 2023-04-14 ENCOUNTER — Other Ambulatory Visit: Payer: Self-pay

## 2023-04-14 MED ORDER — ATORVASTATIN CALCIUM 40 MG PO TABS
40.0000 mg | ORAL_TABLET | Freq: Every day | ORAL | 1 refills | Status: DC
  Filled 2023-04-14: qty 90, 90d supply, fill #0
  Filled 2023-07-17: qty 90, 90d supply, fill #1

## 2023-04-14 MED ORDER — GLIMEPIRIDE 4 MG PO TABS
8.0000 mg | ORAL_TABLET | Freq: Every day | ORAL | 3 refills | Status: DC
  Filled 2023-04-14 – 2023-07-17 (×2): qty 180, 90d supply, fill #0
  Filled 2023-12-04: qty 180, 90d supply, fill #1
  Filled 2024-03-26: qty 180, 90d supply, fill #2

## 2023-04-15 ENCOUNTER — Other Ambulatory Visit: Payer: Self-pay

## 2023-05-21 ENCOUNTER — Other Ambulatory Visit: Payer: Self-pay

## 2023-05-22 ENCOUNTER — Other Ambulatory Visit: Payer: Self-pay

## 2023-05-22 MED ORDER — DAPAGLIFLOZIN PROPANEDIOL 10 MG PO TABS
10.0000 mg | ORAL_TABLET | Freq: Every day | ORAL | 1 refills | Status: DC
  Filled 2023-05-22: qty 30, 30d supply, fill #0
  Filled 2023-06-19: qty 30, 30d supply, fill #1

## 2023-05-23 ENCOUNTER — Other Ambulatory Visit: Payer: Self-pay

## 2023-05-23 MED ORDER — TELMISARTAN 40 MG PO TABS
40.0000 mg | ORAL_TABLET | Freq: Every day | ORAL | 1 refills | Status: AC
  Filled 2023-05-23: qty 90, 90d supply, fill #0
  Filled 2023-08-18: qty 90, 90d supply, fill #1

## 2023-07-02 ENCOUNTER — Other Ambulatory Visit: Payer: Self-pay

## 2023-07-02 MED ORDER — CEPHALEXIN 500 MG PO CAPS
500.0000 mg | ORAL_CAPSULE | Freq: Three times a day (TID) | ORAL | 0 refills | Status: AC
  Filled 2023-07-02: qty 15, 5d supply, fill #0

## 2023-07-17 ENCOUNTER — Other Ambulatory Visit: Payer: Self-pay

## 2023-07-18 ENCOUNTER — Other Ambulatory Visit (HOSPITAL_BASED_OUTPATIENT_CLINIC_OR_DEPARTMENT_OTHER): Payer: Self-pay

## 2023-07-18 ENCOUNTER — Other Ambulatory Visit: Payer: Self-pay

## 2023-07-21 ENCOUNTER — Other Ambulatory Visit: Payer: Self-pay

## 2023-07-21 MED ORDER — HYDROCHLOROTHIAZIDE 25 MG PO TABS
25.0000 mg | ORAL_TABLET | Freq: Every day | ORAL | 0 refills | Status: DC
  Filled 2023-07-21: qty 30, 30d supply, fill #0

## 2023-07-21 MED ORDER — DAPAGLIFLOZIN PROPANEDIOL 10 MG PO TABS
10.0000 mg | ORAL_TABLET | Freq: Every day | ORAL | 1 refills | Status: DC
  Filled 2023-07-21: qty 30, 30d supply, fill #0
  Filled 2023-08-26: qty 30, 30d supply, fill #1

## 2023-08-18 ENCOUNTER — Other Ambulatory Visit: Payer: Self-pay

## 2023-08-18 MED ORDER — DICYCLOMINE HCL 10 MG PO CAPS
10.0000 mg | ORAL_CAPSULE | Freq: Three times a day (TID) | ORAL | 3 refills | Status: AC
  Filled 2023-08-18: qty 270, 90d supply, fill #0
  Filled 2024-07-30: qty 270, 90d supply, fill #1

## 2023-08-18 MED ORDER — METFORMIN HCL 1000 MG PO TABS
1000.0000 mg | ORAL_TABLET | Freq: Two times a day (BID) | ORAL | 1 refills | Status: DC
  Filled 2023-08-18: qty 180, 90d supply, fill #0
  Filled 2024-03-26: qty 180, 90d supply, fill #1

## 2023-08-26 ENCOUNTER — Other Ambulatory Visit: Payer: Self-pay

## 2023-10-22 ENCOUNTER — Other Ambulatory Visit: Payer: Self-pay

## 2023-10-22 MED ORDER — AZITHROMYCIN 250 MG PO TABS
ORAL_TABLET | ORAL | 0 refills | Status: AC
  Filled 2023-10-22: qty 6, 5d supply, fill #0

## 2023-10-24 ENCOUNTER — Other Ambulatory Visit: Payer: Self-pay

## 2023-10-27 ENCOUNTER — Other Ambulatory Visit: Payer: Self-pay

## 2023-10-27 MED ORDER — DAPAGLIFLOZIN PROPANEDIOL 10 MG PO TABS
10.0000 mg | ORAL_TABLET | Freq: Every day | ORAL | 1 refills | Status: DC
  Filled 2023-10-27: qty 90, 90d supply, fill #0
  Filled 2024-02-26: qty 90, 90d supply, fill #1

## 2023-10-30 DIAGNOSIS — E785 Hyperlipidemia, unspecified: Secondary | ICD-10-CM | POA: Diagnosis not present

## 2023-10-30 DIAGNOSIS — I10 Essential (primary) hypertension: Secondary | ICD-10-CM | POA: Diagnosis not present

## 2023-10-30 DIAGNOSIS — E118 Type 2 diabetes mellitus with unspecified complications: Secondary | ICD-10-CM | POA: Diagnosis not present

## 2023-11-06 ENCOUNTER — Encounter: Payer: Self-pay | Admitting: Pharmacist

## 2023-11-06 ENCOUNTER — Other Ambulatory Visit: Payer: Self-pay

## 2023-11-06 DIAGNOSIS — K760 Fatty (change of) liver, not elsewhere classified: Secondary | ICD-10-CM | POA: Diagnosis not present

## 2023-11-06 DIAGNOSIS — Z0000 Encounter for general adult medical examination without abnormal findings: Secondary | ICD-10-CM | POA: Diagnosis not present

## 2023-11-06 DIAGNOSIS — I10 Essential (primary) hypertension: Secondary | ICD-10-CM | POA: Diagnosis not present

## 2023-11-06 DIAGNOSIS — E118 Type 2 diabetes mellitus with unspecified complications: Secondary | ICD-10-CM | POA: Diagnosis not present

## 2023-11-06 DIAGNOSIS — Z1231 Encounter for screening mammogram for malignant neoplasm of breast: Secondary | ICD-10-CM | POA: Diagnosis not present

## 2023-11-06 DIAGNOSIS — R801 Persistent proteinuria, unspecified: Secondary | ICD-10-CM | POA: Diagnosis not present

## 2023-11-06 DIAGNOSIS — E785 Hyperlipidemia, unspecified: Secondary | ICD-10-CM | POA: Diagnosis not present

## 2023-11-06 DIAGNOSIS — R601 Generalized edema: Secondary | ICD-10-CM | POA: Diagnosis not present

## 2023-11-06 MED ORDER — HYDROCHLOROTHIAZIDE 25 MG PO TABS
25.0000 mg | ORAL_TABLET | Freq: Every day | ORAL | 3 refills | Status: AC
  Filled 2023-11-06: qty 90, 90d supply, fill #0
  Filled 2024-02-26: qty 90, 90d supply, fill #1

## 2023-11-06 MED ORDER — ATORVASTATIN CALCIUM 80 MG PO TABS
80.0000 mg | ORAL_TABLET | Freq: Every day | ORAL | 3 refills | Status: AC
  Filled 2023-11-06 – 2024-01-02 (×2): qty 90, 90d supply, fill #0
  Filled 2024-05-02: qty 90, 90d supply, fill #1

## 2023-11-06 MED ORDER — MOUNJARO 5 MG/0.5ML ~~LOC~~ SOAJ
5.0000 mg | SUBCUTANEOUS | 3 refills | Status: DC
Start: 2023-11-06 — End: 2023-12-04
  Filled 2023-11-06: qty 2, 28d supply, fill #0

## 2023-12-04 ENCOUNTER — Other Ambulatory Visit: Payer: Self-pay

## 2023-12-04 MED ORDER — MOUNJARO 7.5 MG/0.5ML ~~LOC~~ SOAJ
7.5000 mg | SUBCUTANEOUS | 5 refills | Status: DC
  Filled 2023-12-04: qty 2, 28d supply, fill #0
  Filled 2024-01-01: qty 2, 28d supply, fill #1

## 2023-12-05 ENCOUNTER — Other Ambulatory Visit: Payer: Self-pay

## 2023-12-05 MED ORDER — TELMISARTAN 40 MG PO TABS
40.0000 mg | ORAL_TABLET | Freq: Every day | ORAL | 1 refills | Status: DC
  Filled 2023-12-05: qty 90, 90d supply, fill #0
  Filled 2024-03-26: qty 90, 90d supply, fill #1

## 2024-01-02 ENCOUNTER — Other Ambulatory Visit: Payer: Self-pay

## 2024-01-27 ENCOUNTER — Other Ambulatory Visit: Payer: Self-pay

## 2024-01-27 MED ORDER — MOUNJARO 10 MG/0.5ML ~~LOC~~ SOAJ
10.0000 mg | SUBCUTANEOUS | 11 refills | Status: DC
  Filled 2024-01-27: qty 2, 28d supply, fill #0
  Filled 2024-02-24: qty 2, 28d supply, fill #1
  Filled 2024-03-25: qty 2, 28d supply, fill #2

## 2024-02-24 ENCOUNTER — Other Ambulatory Visit: Payer: Self-pay

## 2024-02-26 ENCOUNTER — Other Ambulatory Visit: Payer: Self-pay

## 2024-03-19 DIAGNOSIS — E785 Hyperlipidemia, unspecified: Secondary | ICD-10-CM | POA: Diagnosis not present

## 2024-03-19 DIAGNOSIS — E118 Type 2 diabetes mellitus with unspecified complications: Secondary | ICD-10-CM | POA: Diagnosis not present

## 2024-03-22 ENCOUNTER — Other Ambulatory Visit: Payer: Self-pay

## 2024-03-22 MED ORDER — CEPHALEXIN 500 MG PO CAPS
500.0000 mg | ORAL_CAPSULE | Freq: Three times a day (TID) | ORAL | 0 refills | Status: AC
  Filled 2024-03-22: qty 15, 5d supply, fill #0

## 2024-03-26 ENCOUNTER — Other Ambulatory Visit: Payer: Self-pay

## 2024-03-30 ENCOUNTER — Other Ambulatory Visit: Payer: Self-pay

## 2024-03-30 DIAGNOSIS — R801 Persistent proteinuria, unspecified: Secondary | ICD-10-CM | POA: Diagnosis not present

## 2024-03-30 DIAGNOSIS — E118 Type 2 diabetes mellitus with unspecified complications: Secondary | ICD-10-CM | POA: Diagnosis not present

## 2024-03-30 DIAGNOSIS — R300 Dysuria: Secondary | ICD-10-CM | POA: Diagnosis not present

## 2024-03-30 DIAGNOSIS — K760 Fatty (change of) liver, not elsewhere classified: Secondary | ICD-10-CM | POA: Diagnosis not present

## 2024-03-30 DIAGNOSIS — R601 Generalized edema: Secondary | ICD-10-CM | POA: Diagnosis not present

## 2024-03-30 DIAGNOSIS — E785 Hyperlipidemia, unspecified: Secondary | ICD-10-CM | POA: Diagnosis not present

## 2024-03-30 DIAGNOSIS — I10 Essential (primary) hypertension: Secondary | ICD-10-CM | POA: Diagnosis not present

## 2024-03-30 MED ORDER — MOUNJARO 12.5 MG/0.5ML ~~LOC~~ SOAJ
12.5000 mg | SUBCUTANEOUS | 11 refills | Status: AC
  Filled 2024-03-30 – 2024-04-21 (×2): qty 2, 28d supply, fill #0
  Filled 2024-05-19: qty 2, 28d supply, fill #1
  Filled 2024-06-16: qty 2, 28d supply, fill #2
  Filled 2024-07-16: qty 2, 28d supply, fill #3
  Filled 2024-08-12: qty 2, 28d supply, fill #4
  Filled 2024-09-10: qty 2, 28d supply, fill #5

## 2024-04-08 ENCOUNTER — Other Ambulatory Visit: Payer: Self-pay | Admitting: Internal Medicine

## 2024-04-08 DIAGNOSIS — R109 Unspecified abdominal pain: Secondary | ICD-10-CM

## 2024-04-21 ENCOUNTER — Other Ambulatory Visit: Payer: Self-pay

## 2024-05-02 ENCOUNTER — Other Ambulatory Visit: Payer: Self-pay

## 2024-05-03 ENCOUNTER — Other Ambulatory Visit: Payer: Self-pay

## 2024-05-04 ENCOUNTER — Other Ambulatory Visit: Payer: Self-pay

## 2024-05-04 MED ORDER — RABEPRAZOLE SODIUM 20 MG PO TBEC
20.0000 mg | DELAYED_RELEASE_TABLET | Freq: Every day | ORAL | 3 refills | Status: AC
  Filled 2024-05-04: qty 90, 90d supply, fill #0
  Filled 2024-07-30: qty 90, 90d supply, fill #1

## 2024-05-16 DIAGNOSIS — L235 Allergic contact dermatitis due to other chemical products: Secondary | ICD-10-CM | POA: Diagnosis not present

## 2024-06-16 ENCOUNTER — Other Ambulatory Visit: Payer: Self-pay

## 2024-06-17 ENCOUNTER — Other Ambulatory Visit: Payer: Self-pay

## 2024-06-17 MED ORDER — DAPAGLIFLOZIN PROPANEDIOL 10 MG PO TABS
10.0000 mg | ORAL_TABLET | Freq: Every day | ORAL | 1 refills | Status: AC
  Filled 2024-06-17: qty 90, 90d supply, fill #0

## 2024-06-17 MED ORDER — TELMISARTAN 40 MG PO TABS
40.0000 mg | ORAL_TABLET | Freq: Every day | ORAL | 1 refills | Status: AC
  Filled 2024-06-17: qty 90, 90d supply, fill #0

## 2024-07-15 ENCOUNTER — Other Ambulatory Visit: Payer: Self-pay

## 2024-07-15 MED ORDER — AZITHROMYCIN 250 MG PO TABS
ORAL_TABLET | ORAL | 0 refills | Status: AC
  Filled 2024-07-15: qty 6, 5d supply, fill #0

## 2024-07-16 ENCOUNTER — Other Ambulatory Visit: Payer: Self-pay

## 2024-07-30 ENCOUNTER — Other Ambulatory Visit: Payer: Self-pay

## 2024-08-02 ENCOUNTER — Other Ambulatory Visit: Payer: Self-pay

## 2024-08-02 MED ORDER — METFORMIN HCL 1000 MG PO TABS
1000.0000 mg | ORAL_TABLET | Freq: Two times a day (BID) | ORAL | 0 refills | Status: AC
  Filled 2024-08-02: qty 180, 90d supply, fill #0

## 2024-08-02 MED ORDER — GLIMEPIRIDE 4 MG PO TABS
8.0000 mg | ORAL_TABLET | Freq: Every day | ORAL | 0 refills | Status: AC
  Filled 2024-08-02: qty 180, 90d supply, fill #0
